# Patient Record
Sex: Male | Born: 1950
Health system: Southern US, Community
[De-identification: ages and names within clinical notes are randomized; demographics above are authoritative.]

## PROBLEM LIST (undated history)

## (undated) DIAGNOSIS — I10 Essential (primary) hypertension: Secondary | ICD-10-CM

## (undated) DIAGNOSIS — C61 Malignant neoplasm of prostate: Secondary | ICD-10-CM

## (undated) HISTORY — PX: ROTATOR CUFF REPAIR: SHX139

## (undated) HISTORY — PX: REPLACEMENT TOTAL KNEE: SUR1224

---

## 2005-10-12 ENCOUNTER — Inpatient Hospital Stay (HOSPITAL_COMMUNITY): Admission: RE | Admit: 2005-10-12 | Discharge: 2005-10-16 | Payer: Self-pay | Admitting: Orthopedic Surgery

## 2007-01-28 IMAGING — CR DG CHEST 2V
2 series · 2 of 2 positions shown · non-contrast
Comparison: None.

CLINICAL DATA: Preop right knee.
 CHEST - 2 VIEW:

[view not recorded (1 of 2)]
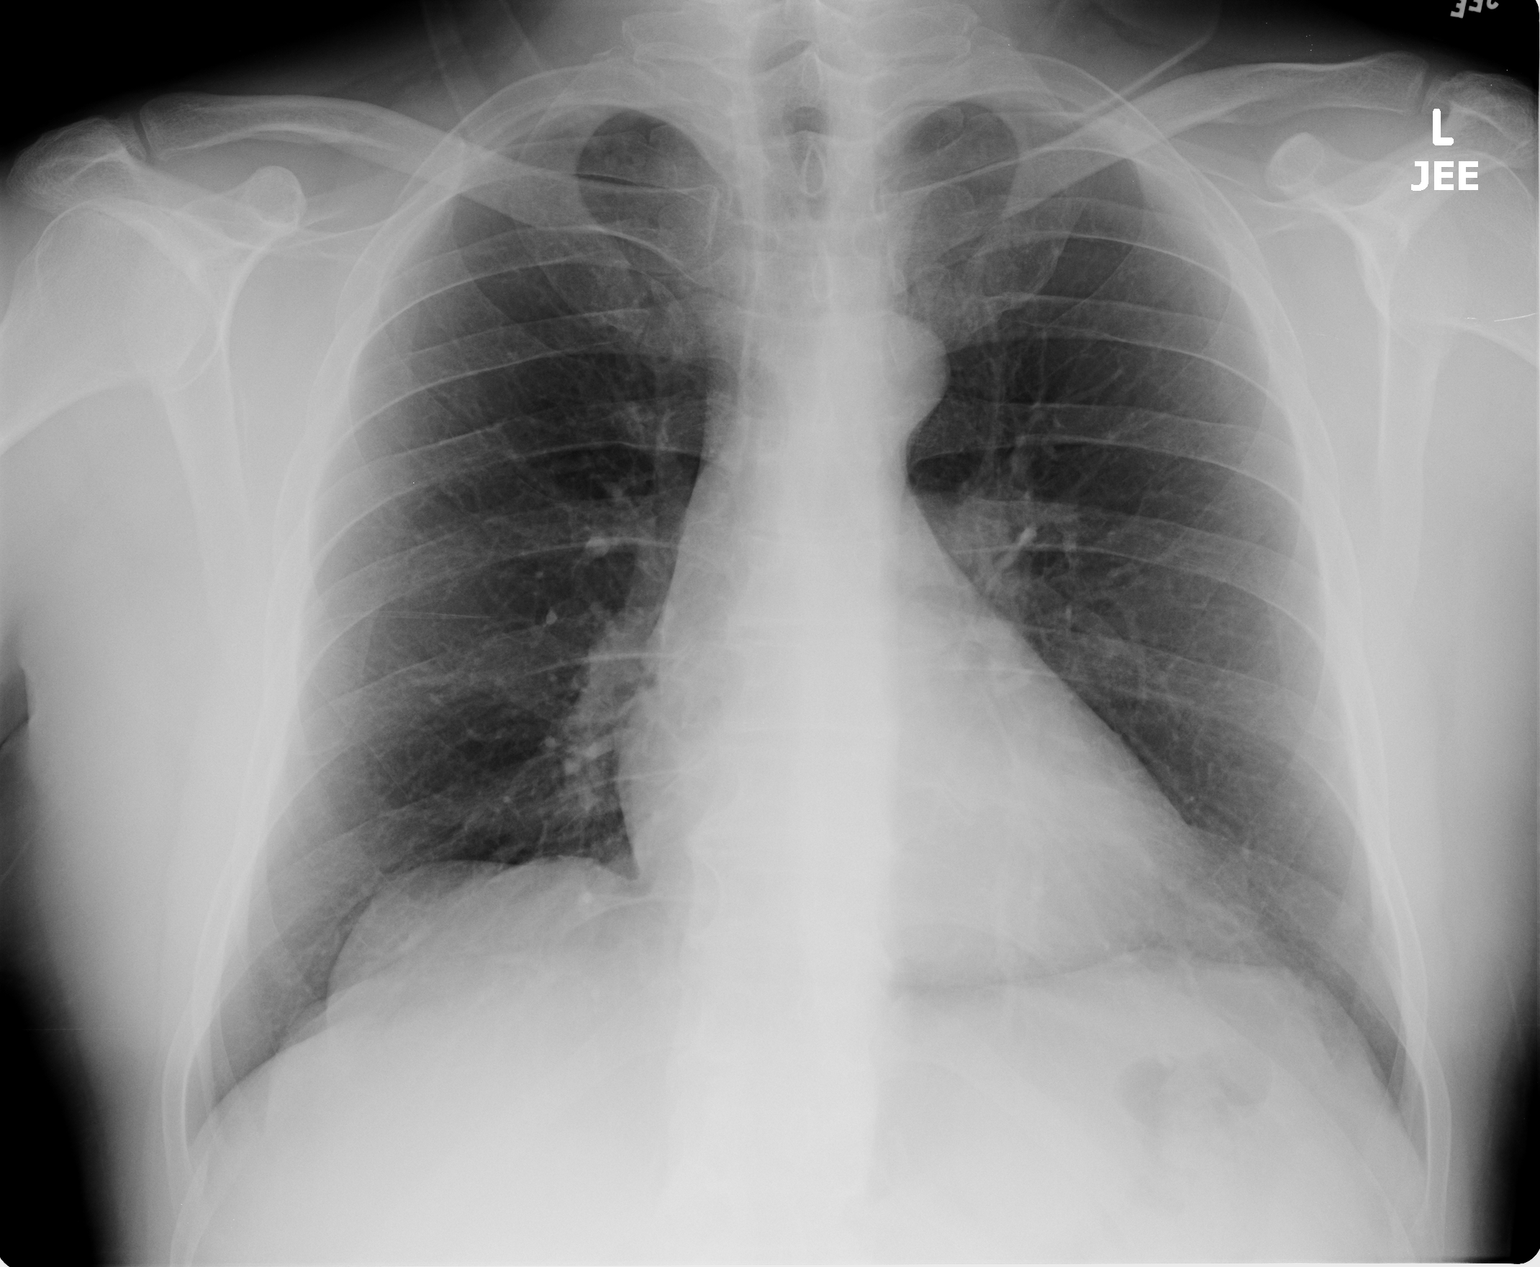

[view not recorded (2 of 2)]
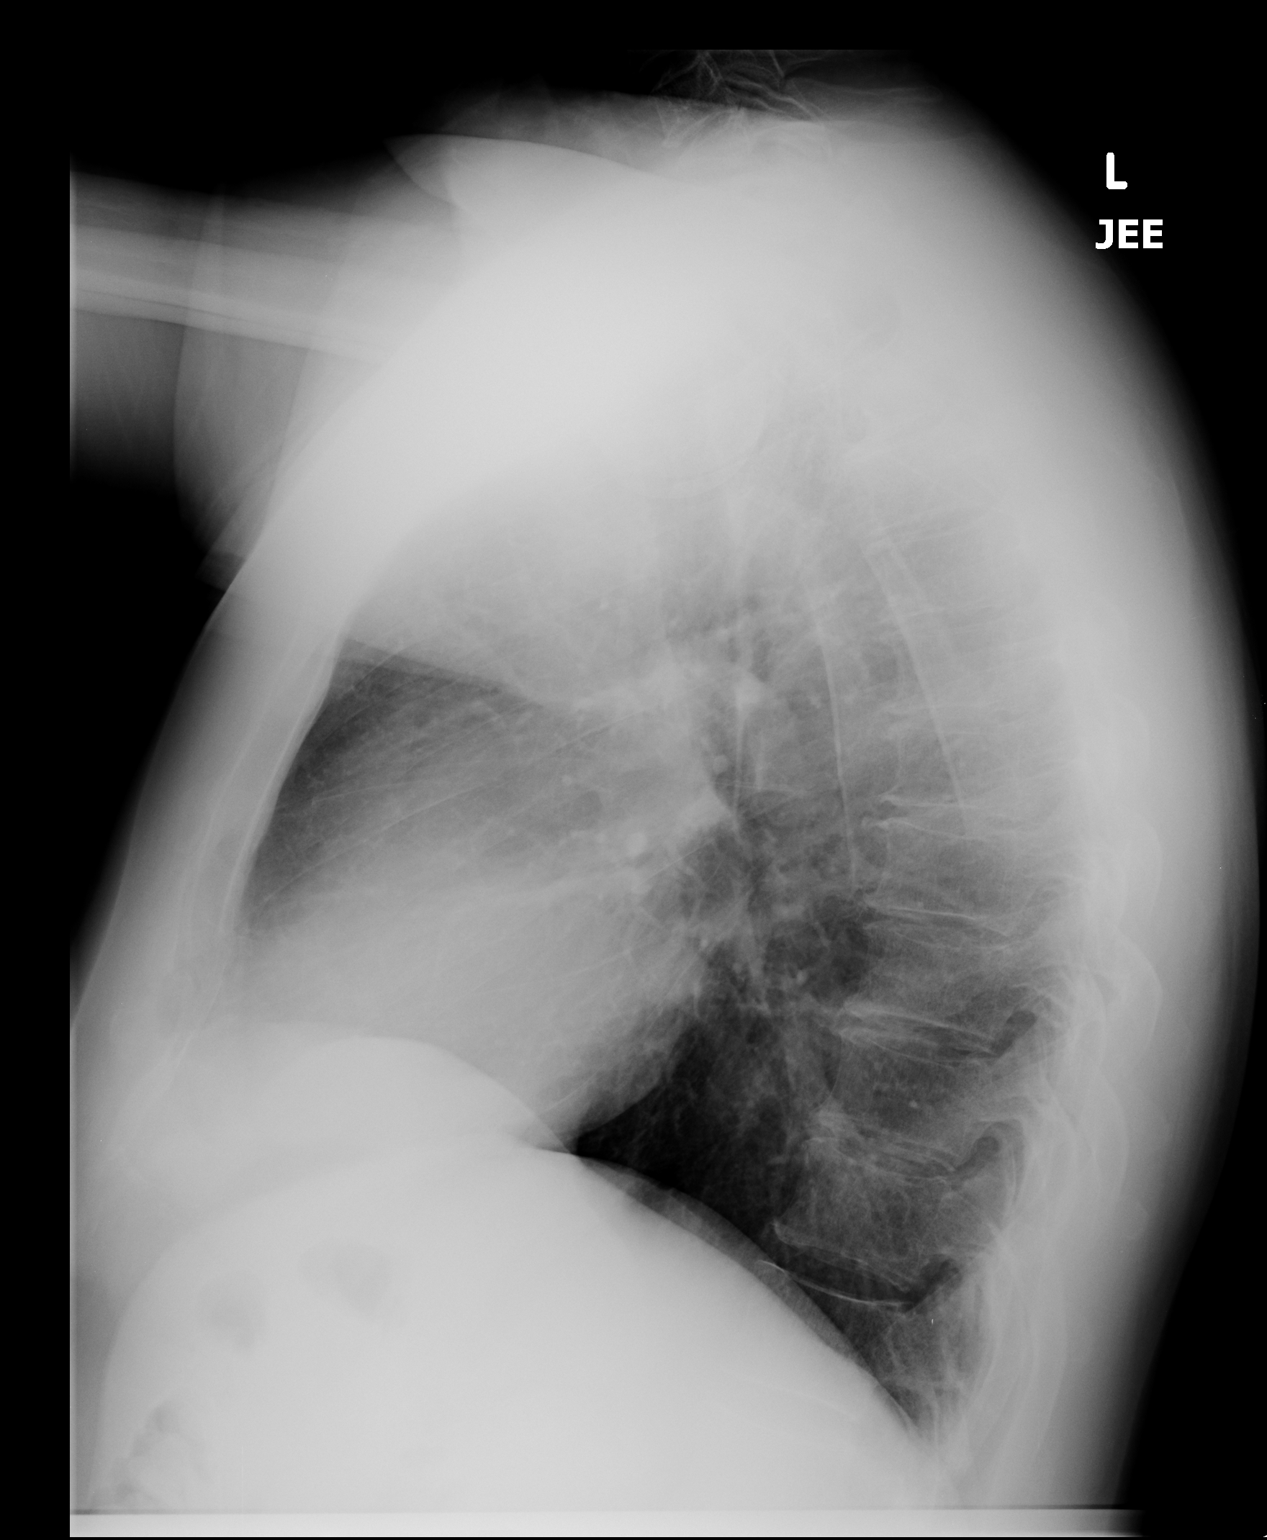

[2 of 2 positions shown; findings below may reference images not displayed]

FINDINGS: Heart size is within normal limits.  Trachea is midline.  Lungs are clear.  No pleural fluid.
IMPRESSION: No acute findings.

## 2007-01-31 IMAGING — CR DG KNEE 1-2V PORT*R*
2 series · 2 of 2 positions shown · non-contrast
Comparison: none

CLINICAL DATA: Osteoarthritis.  Right TKA.  
 PORTABLE RIGHT KNEE - 1 VIEW 10/12/05 AT 4147 HOURS:

[view not recorded (1 of 2)]
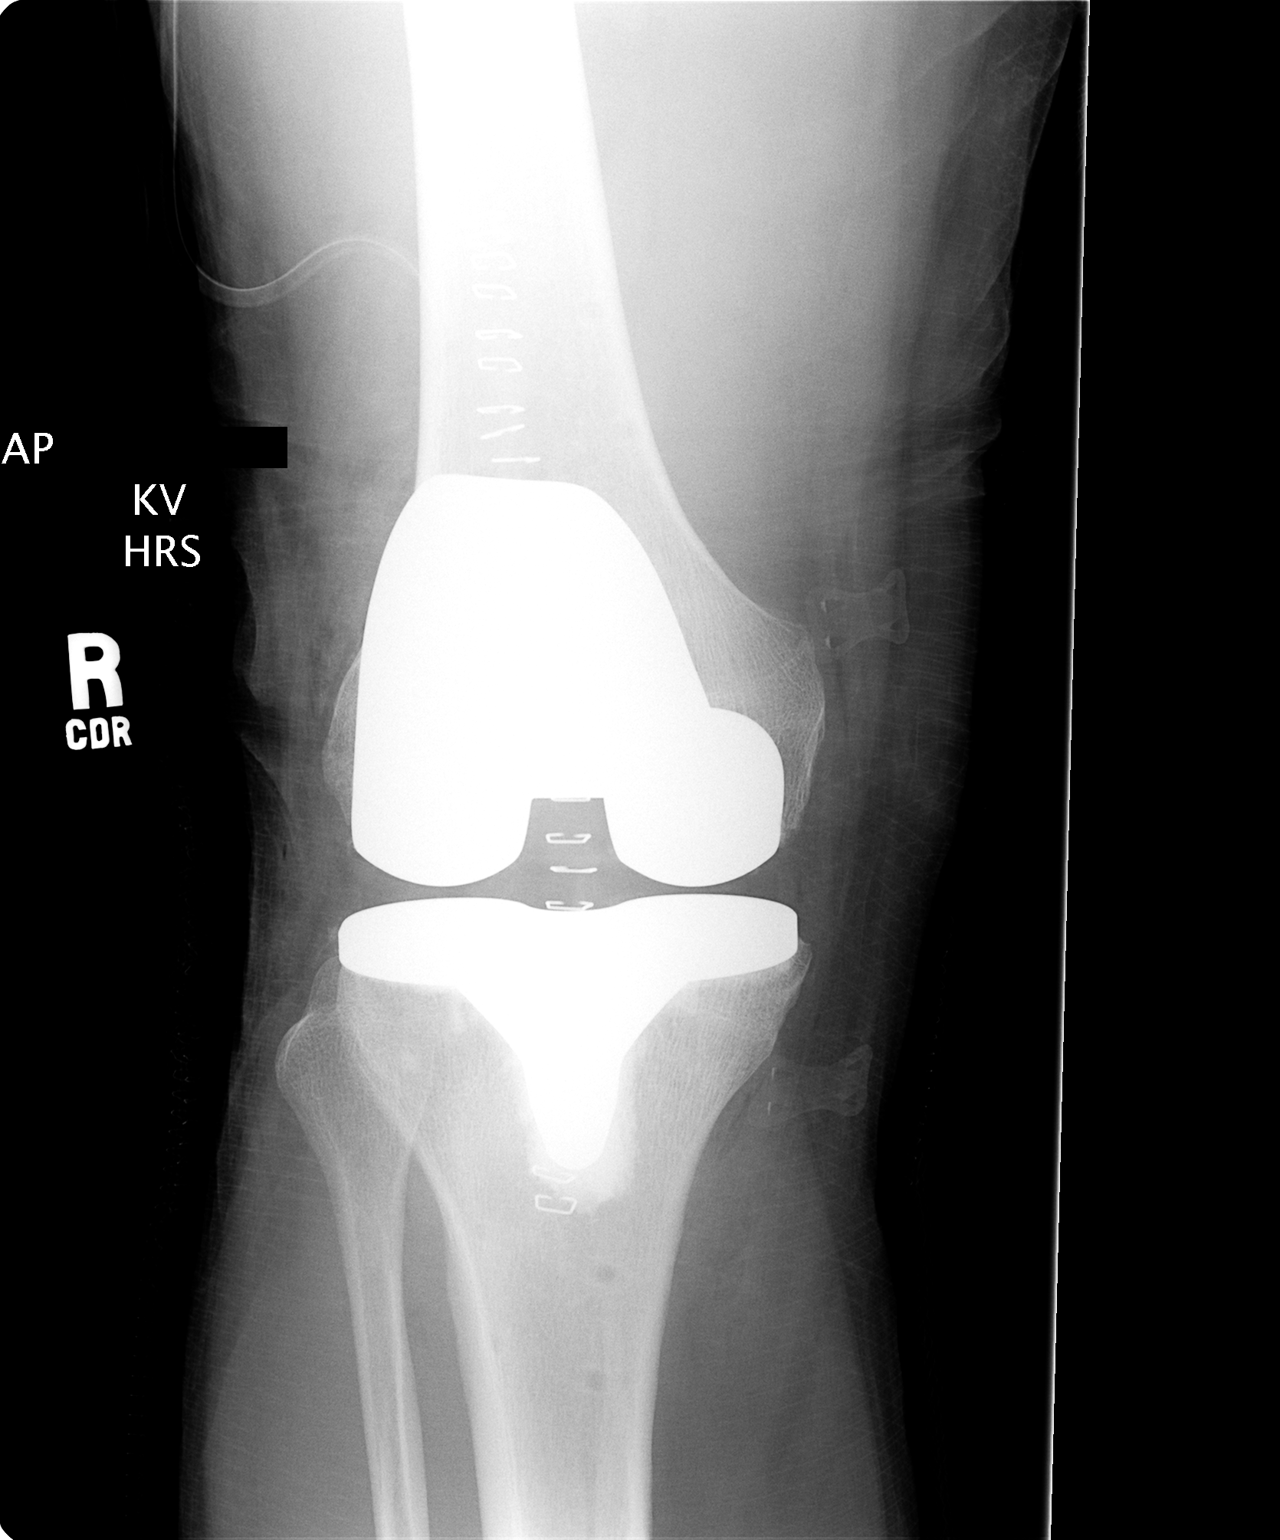

[view not recorded (2 of 2)]
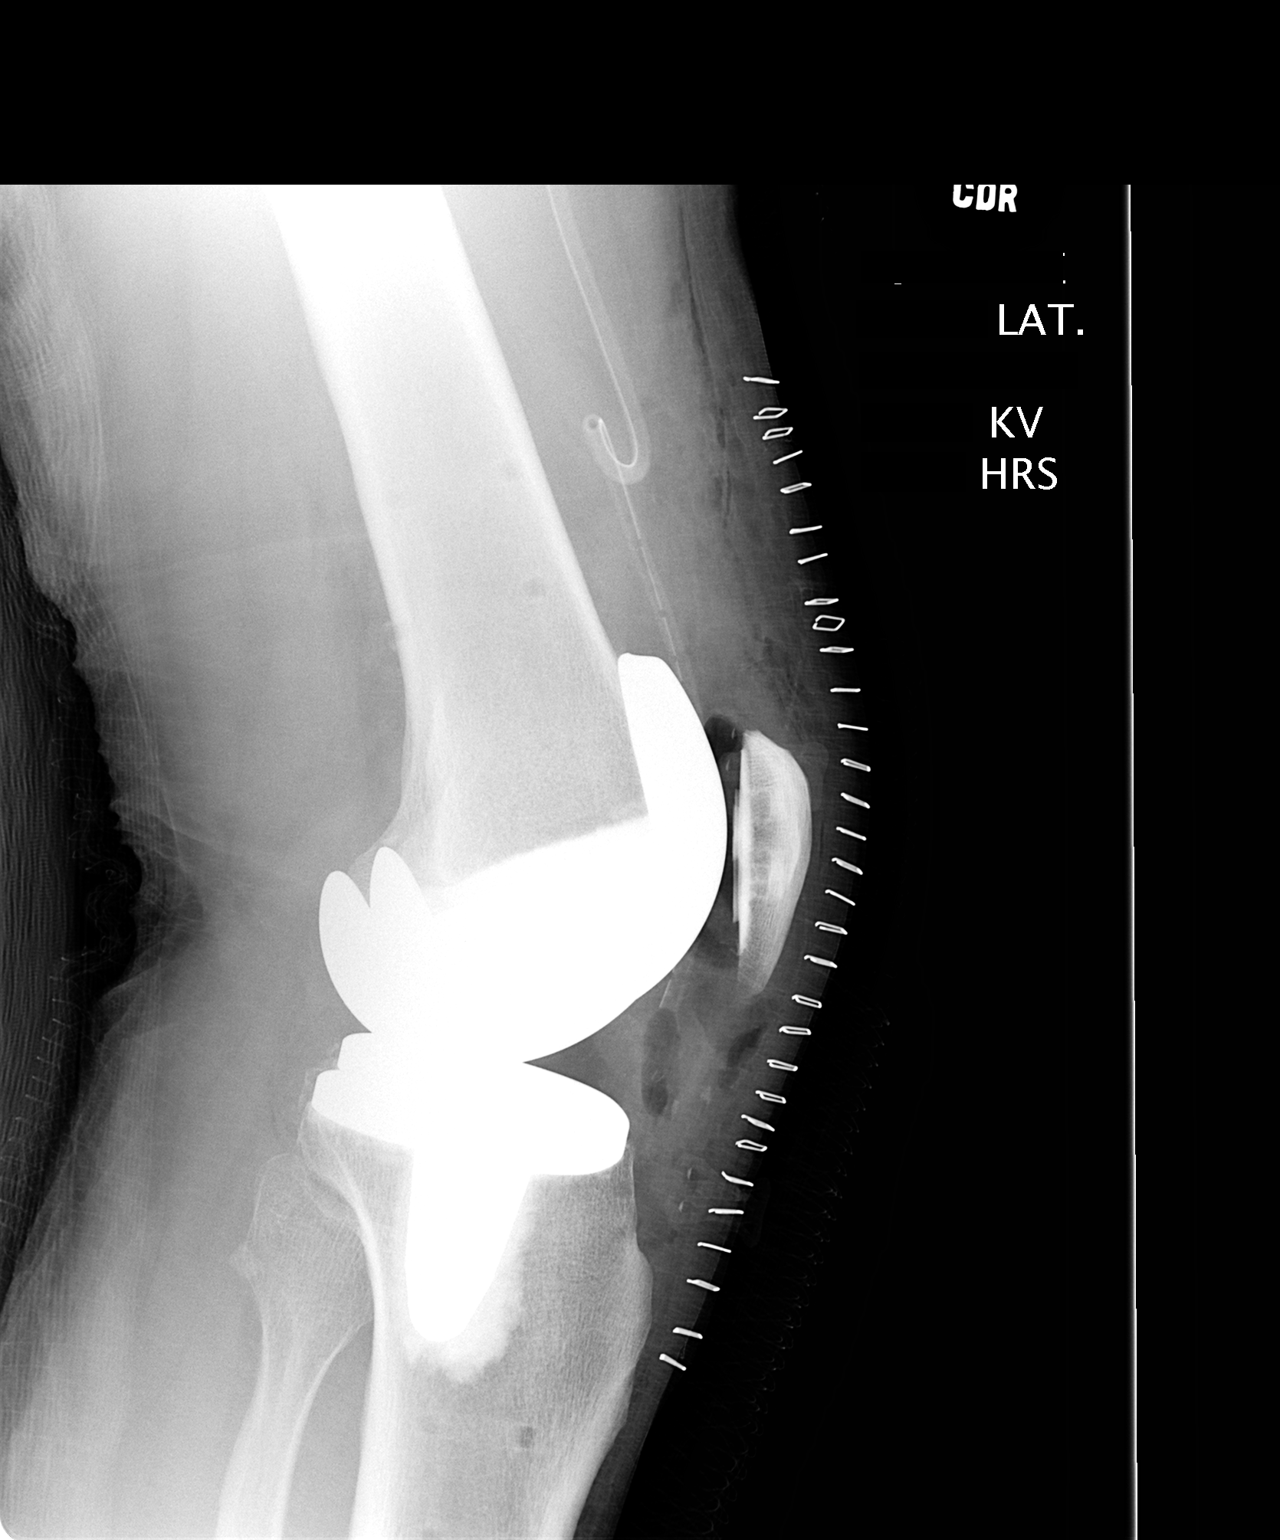

[2 of 2 positions shown; findings below may reference images not displayed]

FINDINGS: Distal femoral proximal tibial and patellar prosthetic components appear in satisfactory position and alignment.  Vertical row of superficial staples anteriorly.  Radiopaque drain in place.
IMPRESSION: Satisfactory appearance right TKA.

## 2018-08-08 DIAGNOSIS — Z23 Encounter for immunization: Secondary | ICD-10-CM | POA: Diagnosis not present

## 2018-08-08 DIAGNOSIS — C787 Secondary malignant neoplasm of liver and intrahepatic bile duct: Secondary | ICD-10-CM | POA: Diagnosis not present

## 2018-08-08 DIAGNOSIS — R972 Elevated prostate specific antigen [PSA]: Secondary | ICD-10-CM | POA: Diagnosis not present

## 2018-08-08 DIAGNOSIS — N400 Enlarged prostate without lower urinary tract symptoms: Secondary | ICD-10-CM | POA: Diagnosis not present

## 2018-08-08 DIAGNOSIS — Z20828 Contact with and (suspected) exposure to other viral communicable diseases: Secondary | ICD-10-CM | POA: Diagnosis not present

## 2018-08-08 DIAGNOSIS — E782 Mixed hyperlipidemia: Secondary | ICD-10-CM | POA: Diagnosis not present

## 2018-08-08 DIAGNOSIS — C184 Malignant neoplasm of transverse colon: Secondary | ICD-10-CM | POA: Diagnosis not present

## 2018-08-08 DIAGNOSIS — K449 Diaphragmatic hernia without obstruction or gangrene: Secondary | ICD-10-CM | POA: Diagnosis not present

## 2018-08-08 DIAGNOSIS — Z01812 Encounter for preprocedural laboratory examination: Secondary | ICD-10-CM | POA: Diagnosis not present

## 2018-08-08 DIAGNOSIS — Z6829 Body mass index (BMI) 29.0-29.9, adult: Secondary | ICD-10-CM | POA: Diagnosis not present

## 2018-08-08 DIAGNOSIS — I10 Essential (primary) hypertension: Secondary | ICD-10-CM | POA: Diagnosis not present

## 2018-08-08 DIAGNOSIS — Z79899 Other long term (current) drug therapy: Secondary | ICD-10-CM | POA: Diagnosis not present

## 2018-08-08 DIAGNOSIS — R739 Hyperglycemia, unspecified: Secondary | ICD-10-CM | POA: Diagnosis not present

## 2018-08-08 DIAGNOSIS — E663 Overweight: Secondary | ICD-10-CM | POA: Diagnosis not present

## 2018-08-08 DIAGNOSIS — T63441D Toxic effect of venom of bees, accidental (unintentional), subsequent encounter: Secondary | ICD-10-CM | POA: Diagnosis not present

## 2018-08-08 DIAGNOSIS — K2100 Gastro-esophageal reflux disease with esophagitis, without bleeding: Secondary | ICD-10-CM | POA: Diagnosis not present

## 2018-11-08 DIAGNOSIS — R972 Elevated prostate specific antigen [PSA]: Secondary | ICD-10-CM | POA: Diagnosis not present

## 2018-11-08 DIAGNOSIS — N401 Enlarged prostate with lower urinary tract symptoms: Secondary | ICD-10-CM | POA: Diagnosis not present

## 2018-11-08 DIAGNOSIS — R3912 Poor urinary stream: Secondary | ICD-10-CM | POA: Diagnosis not present

## 2018-12-27 DIAGNOSIS — R972 Elevated prostate specific antigen [PSA]: Secondary | ICD-10-CM | POA: Diagnosis not present

## 2019-02-04 DIAGNOSIS — C61 Malignant neoplasm of prostate: Secondary | ICD-10-CM | POA: Diagnosis not present

## 2019-05-09 DIAGNOSIS — C61 Malignant neoplasm of prostate: Secondary | ICD-10-CM | POA: Diagnosis not present

## 2019-05-09 DIAGNOSIS — R3912 Poor urinary stream: Secondary | ICD-10-CM | POA: Diagnosis not present

## 2019-05-09 DIAGNOSIS — N401 Enlarged prostate with lower urinary tract symptoms: Secondary | ICD-10-CM | POA: Diagnosis not present

## 2019-05-16 ENCOUNTER — Other Ambulatory Visit: Payer: Self-pay | Admitting: Urology

## 2019-05-26 DIAGNOSIS — Z20828 Contact with and (suspected) exposure to other viral communicable diseases: Secondary | ICD-10-CM | POA: Diagnosis not present

## 2019-05-26 DIAGNOSIS — Z20822 Contact with and (suspected) exposure to covid-19: Secondary | ICD-10-CM | POA: Diagnosis not present

## 2019-05-29 DIAGNOSIS — C61 Malignant neoplasm of prostate: Secondary | ICD-10-CM | POA: Diagnosis not present

## 2019-05-29 DIAGNOSIS — M6289 Other specified disorders of muscle: Secondary | ICD-10-CM | POA: Diagnosis not present

## 2019-05-29 DIAGNOSIS — R3912 Poor urinary stream: Secondary | ICD-10-CM | POA: Diagnosis not present

## 2019-05-29 DIAGNOSIS — M6281 Muscle weakness (generalized): Secondary | ICD-10-CM | POA: Diagnosis not present

## 2019-05-29 DIAGNOSIS — M62838 Other muscle spasm: Secondary | ICD-10-CM | POA: Diagnosis not present

## 2019-06-11 NOTE — Patient Instructions (Addendum)
DUE TO COVID-19 ONLY ONE VISITOR IS ALLOWED TO COME WITH YOU AND STAY IN THE WAITING ROOM ONLY DURING PRE OP AND PROCEDURE DAY OF SURGERY. THE 1 VISITOR MAY VISIT WITH YOU AFTER SURGERY IN YOUR PRIVATE ROOM DURING VISITING HOURS ONLY!  YOU NEED TO HAVE A COVID 19 TEST ON: 06/19/19 @  11:00 am      , THIS TEST MUST BE DONE BEFORE SURGERY, COME  [place], [address] , [address].  ([place]) ONCE YOUR COVID TEST IS COMPLETED, PLEASE BEGIN THE QUARANTINE INSTRUCTIONS AS OUTLINED IN YOUR HANDOUT.                MARIN KOELLE     Your procedure is scheduled on: 06/23/19   Report to [facility] Main  Entrance   Report to admitting at: 9:30 AM     Call this number if you have problems the morning of surgery [phone]     Remember: Do not eat food or drink liquids :After Midnight.   BRUSH YOUR TEETH MORNING OF SURGERY AND RINSE YOUR MOUTH OUT, NO CHEWING GUM CANDY OR MINTS.     Take these medicines the morning of surgery with A SIP OF WATER: Tamsulosin (Flomax)                                  You may not have any metal on your body including hair pins and              piercings  Do not wear jewelry, lotions, powders or perfumes, deodorant             Men may shave face and neck.     Do not bring valuables to the hospital. [place].  Contacts, dentures or bridgework may not be worn into surgery.  Leave suitcase in the car. After surgery it may be brought to your room.     Patients discharged the day of surgery will not be allowed to drive home. IF YOU ARE HAVING SURGERY AND GOING HOME THE SAME DAY, YOU MUST HAVE AN ADULT TO DRIVE YOU HOME AND BE WITH YOU FOR 24 HOURS. YOU MAY GO HOME BY TAXI OR UBER OR ORTHERWISE, BUT AN ADULT MUST ACCOMPANY YOU HOME AND STAY WITH YOU FOR 24 HOURS.  Name and phone number of your driver:  Special Instructions: N/A              Please read over the following fact  sheets you were given: _____________________________________________________________________             [facility] - Preparing for Surgery Before surgery, you can play an important role.  Because skin is not sterile, your skin needs to be as free of germs as possible.  You can reduce the number of germs on your skin by washing with CHG (chlorahexidine gluconate) soap before surgery.  CHG is an antiseptic cleaner which kills germs and bonds with the skin to continue killing germs even after washing. Please DO NOT use if you have an allergy to CHG or antibacterial soaps.  If your skin becomes reddened/irritated stop using the CHG and inform your nurse when you arrive at Short Stay. Do not shave (including legs and underarms) for at least 48 hours prior to the first  CHG shower.  You may shave your face/neck. Please follow these instructions carefully:  1.  Shower with CHG Soap the night before surgery and the  morning of Surgery.  2.  If you choose to wash your hair, wash your hair first as usual with your  normal  shampoo.  3.  After you shampoo, rinse your hair and body thoroughly to remove the  shampoo.                           4.  Use CHG as you would any other liquid soap.  You can apply chg directly  to the skin and wash                       Gently with a scrungie or clean washcloth.  5.  Apply the CHG Soap to your body ONLY FROM THE NECK DOWN.   Do not use on face/ open                           Wound or open sores. Avoid contact with eyes, ears mouth and genitals (private parts).                       Wash face,  Genitals (private parts) with your normal soap.             6.  Wash thoroughly, paying special attention to the area where your surgery  will be performed.  7.  Thoroughly rinse your body with warm water from the neck down.  8.  DO NOT shower/wash with your normal soap after using and rinsing off  the CHG Soap.                9.  Pat yourself dry with a clean towel.             10.  Wear clean pajamas.            11.  Place clean sheets on your bed the night of your first shower and do not  sleep with pets. Day of Surgery : Do not apply any lotions/deodorants the morning of surgery.  Please wear clean clothes to the hospital/surgery center.  FAILURE TO FOLLOW THESE INSTRUCTIONS MAY RESULT IN THE CANCELLATION OF YOUR SURGERY PATIENT SIGNATURE_________________________________  NURSE SIGNATURE__________________________________  ________________________________________________________________________   Adam Phenix  An incentive spirometer is a tool that can help keep your lungs clear and active. This tool measures how well you are filling your lungs with each breath. Taking long deep breaths may help reverse or decrease the chance of developing breathing (pulmonary) problems (especially infection) following:  A long period of time when you are unable to move or be active. BEFORE THE PROCEDURE   If the spirometer includes an indicator to show your best effort, your nurse or respiratory therapist will set it to a desired goal.  If possible, sit up straight or lean slightly forward. Try not to slouch.  Hold the incentive spirometer in an upright position. INSTRUCTIONS FOR USE  1. Sit on the edge of your bed if possible, or sit up as far as you can in bed or on a chair. 2. Hold the incentive spirometer in an upright position. 3. Breathe out normally. 4. Place the mouthpiece in your mouth and seal your lips tightly around it. 5. Breathe in slowly and as deeply  as possible, raising the piston or the ball toward the top of the column. 6. Hold your breath for 3-5 seconds or for as long as possible. Allow the piston or ball to fall to the bottom of the column. 7. Remove the mouthpiece from your mouth and breathe out normally. 8. Rest for a few seconds and repeat Steps 1 through 7 at least 10 times every 1-2 hours when you are awake. Take your time and take a  few normal breaths between deep breaths. 9. The spirometer may include an indicator to show your best effort. Use the indicator as a goal to work toward during each repetition. 10. After each set of 10 deep breaths, practice coughing to be sure your lungs are clear. If you have an incision (the cut made at the time of surgery), support your incision when coughing by placing a pillow or rolled up towels firmly against it. Once you are able to get out of bed, walk around indoors and cough well. You may stop using the incentive spirometer when instructed by your caregiver.  RISKS AND COMPLICATIONS  Take your time so you do not get dizzy or light-headed.  If you are in pain, you may need to take or ask for pain medication before doing incentive spirometry. It is harder to take a deep breath if you are having pain. AFTER USE  Rest and breathe slowly and easily.  It can be helpful to keep track of a log of your progress. Your caregiver can provide you with a simple table to help with this. If you are using the spirometer at home, follow these instructions: [place] IF:   You are having difficultly using the spirometer.  You have trouble using the spirometer as often as instructed.  Your pain medication is not giving enough relief while using the spirometer.  You develop fever of 100.5 F (38.1 C) or higher. SEEK IMMEDIATE MEDICAL CARE IF:   You cough up bloody sputum that had not been present before.  You develop fever of 102 F (38.9 C) or greater.  You develop worsening pain at or near the incision site. MAKE SURE YOU:   Understand these instructions.  Will watch your condition.  Will get help right away if you are not doing well or get worse. Document Released: 08/21/2006 Document Revised: 07/03/2011 Document Reviewed: 10/22/2006 Exit[employer] Patient Information 2014 [employer], [place].   ________________________________________________________________________   WHAT IS  A BLOOD TRANSFUSION? Blood Transfusion Information  A transfusion is the replacement of blood or some of its parts. Blood is made up of multiple cells which provide different functions.  Red blood cells carry oxygen and are used for blood loss replacement.  White blood cells fight against infection.  Platelets control bleeding.  Plasma helps clot blood.  Other blood products are available for specialized needs, such as hemophilia or other clotting disorders. BEFORE THE TRANSFUSION  Who gives blood for transfusions?   Healthy volunteers who are fully evaluated to make sure their blood is safe. This is blood bank blood. Transfusion therapy is the safest it has ever been in the practice of medicine. Before blood is taken from a donor, a complete history is taken to make sure that person has no history of diseases nor engages in risky social behavior (examples are intravenous drug use or sexual activity with multiple partners). The donor's travel history is screened to minimize risk of transmitting infections, such as malaria. The donated blood is tested for signs of infectious  diseases, such as HIV and hepatitis. The blood is then tested to be sure it is compatible with you in order to minimize the chance of a transfusion reaction. If you or a relative donates blood, this is often done in anticipation of surgery and is not appropriate for emergency situations. It takes many days to process the donated blood. RISKS AND COMPLICATIONS Although transfusion therapy is very safe and saves many lives, the main dangers of transfusion include:   Getting an infectious disease.  Developing a transfusion reaction. This is an allergic reaction to something in the blood you were given. Every precaution is taken to prevent this. The decision to have a blood transfusion has been considered carefully by your caregiver before blood is given. Blood is not given unless the benefits outweigh the risks. AFTER THE  TRANSFUSION  Right after receiving a blood transfusion, you will usually feel much better and more energetic. This is especially true if your red blood cells have gotten low (anemic). The transfusion raises the level of the red blood cells which carry oxygen, and this usually causes an energy increase.  The nurse administering the transfusion will monitor you carefully for complications. HOME CARE INSTRUCTIONS  No special instructions are needed after a transfusion. You may find your energy is better. Speak with your caregiver about any limitations on activity for underlying diseases you may have. SEEK MEDICAL CARE IF:   Your condition is not improving after your transfusion.  You develop redness or irritation at the intravenous (IV) site. SEEK IMMEDIATE MEDICAL CARE IF:  Any of the following symptoms occur over the next 12 hours:  Shaking chills.  You have a temperature by mouth above 102 F (38.9 C), not controlled by medicine.  Chest, back, or muscle pain.  People around you feel you are not acting correctly or are confused.  Shortness of breath or difficulty breathing.  Dizziness and fainting.  You get a rash or develop hives.  You have a decrease in urine output.  Your urine turns a dark color or changes to pink, red, or brown. Any of the following symptoms occur over the next 10 days:  You have a temperature by mouth above 102 F (38.9 C), not controlled by medicine.  Shortness of breath.  Weakness after normal activity.  The white part of the eye turns yellow (jaundice).  You have a decrease in the amount of urine or are urinating less often.  Your urine turns a dark color or changes to pink, red, or brown. Document Released: 04/07/2000 Document Revised: 07/03/2011 Document Reviewed: 11/25/2007 [facility] Patient Information 2014 [place], [place].  _______________________________________________________________________

## 2019-06-12 ENCOUNTER — Encounter (HOSPITAL_COMMUNITY)
Admission: RE | Admit: 2019-06-12 | Discharge: 2019-06-12 | Disposition: A | Discharge status: Home / Self Care | Payer: PPO | Source: Ambulatory Visit | Attending: Urology | Admitting: Urology

## 2019-06-12 ENCOUNTER — Other Ambulatory Visit: Payer: Self-pay

## 2019-06-12 ENCOUNTER — Encounter (HOSPITAL_COMMUNITY): Payer: Self-pay

## 2019-06-12 DIAGNOSIS — Z01818 Encounter for other preprocedural examination: Secondary | ICD-10-CM | POA: Diagnosis not present

## 2019-06-12 HISTORY — DX: Essential (primary) hypertension: I10

## 2019-06-12 NOTE — Progress Notes (Signed)
PCP - Dr. Christa See. LOV: 08/08/18 Cardiologist -   Chest x-ray -  EKG -  Stress Test -  ECHO -  Cardiac Cath -   Sleep Study -  CPAP -   Fasting Blood Sugar -  Checks Blood Sugar _____ times a day  Blood Thinner Instructions: Aspirin Instructions: Last Dose:  Anesthesia review:   Patient denies shortness of breath, fever, cough and chest pain at PAT appointment   Patient verbalized understanding of instructions that were given to them at the PAT appointment. Patient was also instructed that they will need to review over the PAT instructions again at home before surgery.

## 2019-06-19 ENCOUNTER — Encounter (HOSPITAL_COMMUNITY)
Admission: RE | Admit: 2019-06-19 | Discharge: 2019-06-19 | Disposition: A | Discharge status: Home / Self Care | Payer: PPO | Source: Ambulatory Visit | Attending: Urology | Admitting: Urology

## 2019-06-19 ENCOUNTER — Other Ambulatory Visit: Payer: Self-pay

## 2019-06-19 ENCOUNTER — Other Ambulatory Visit (HOSPITAL_COMMUNITY)
Admission: RE | Admit: 2019-06-19 | Discharge: 2019-06-19 | Disposition: A | Discharge status: Home / Self Care | Payer: PPO | Source: Ambulatory Visit | Attending: Urology | Admitting: Urology

## 2019-06-19 DIAGNOSIS — Z20822 Contact with and (suspected) exposure to covid-19: Secondary | ICD-10-CM | POA: Diagnosis not present

## 2019-06-19 DIAGNOSIS — Z01812 Encounter for preprocedural laboratory examination: Secondary | ICD-10-CM | POA: Insufficient documentation

## 2019-06-19 DIAGNOSIS — M6281 Muscle weakness (generalized): Secondary | ICD-10-CM | POA: Diagnosis not present

## 2019-06-19 DIAGNOSIS — Z01810 Encounter for preprocedural cardiovascular examination: Secondary | ICD-10-CM | POA: Diagnosis not present

## 2019-06-19 DIAGNOSIS — N393 Stress incontinence (female) (male): Secondary | ICD-10-CM | POA: Diagnosis not present

## 2019-06-19 DIAGNOSIS — M62838 Other muscle spasm: Secondary | ICD-10-CM | POA: Diagnosis not present

## 2019-06-19 LAB — CBC
HCT: 47 % (ref 39.0–52.0)
Hemoglobin: 15.4 g/dL (ref 13.0–17.0)
MCH: 31.6 pg (ref 26.0–34.0)
MCHC: 32.8 g/dL (ref 30.0–36.0)
MCV: 96.3 fL (ref 80.0–100.0)
Platelets: 237 10*3/uL (ref 150–400)
RBC: 4.88 MIL/uL (ref 4.22–5.81)
RDW: 12.4 % (ref 11.5–15.5)
WBC: 4.1 10*3/uL (ref 4.0–10.5)
nRBC: 0 % (ref 0.0–0.2)

## 2019-06-19 LAB — ABO/RH: ABO/RH(D): O POS

## 2019-06-19 LAB — BASIC METABOLIC PANEL
Anion gap: 7 (ref 5–15)
BUN: 18 mg/dL (ref 8–23)
CO2: 27 mmol/L (ref 22–32)
Calcium: 9.9 mg/dL (ref 8.9–10.3)
Chloride: 107 mmol/L (ref 98–111)
Creatinine, Ser: 1.06 mg/dL (ref 0.61–1.24)
GFR calc Af Amer: >60 mL/min (ref >60)
GFR calc non Af Amer: >60 mL/min (ref >60)
Glucose, Bld: 102 mg/dL — ABNORMAL HIGH (ref 70–99)
Potassium: 4.3 mmol/L (ref 3.5–5.1)
Sodium: 141 mmol/L (ref 135–145)

## 2019-06-19 LAB — SARS CORONAVIRUS 2 (TAT 6-24 HRS): SARS Coronavirus 2: NEGATIVE

## 2019-06-20 NOTE — H&P (Signed)
CC: Prostate Cancer   PCP: Dr. Christa See   [name] is a 69 year old gentleman who was noted to have an elevated PSA of 10.36 that remained elevated at 4.18 when rechecked. 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   Family history: None.   Imaging studies: None.   PMH: He has a history of hypertension, hyperlipidemia, and BPH.  PSH: No abdominal surgeries.   TNM stage: cT1c Nx Mx  PSA: 4.18  Gleason score: 3+4=7 (grade group 2)  Biopsy (12/27/18): 5/12 cores positive  Left: L lateral apex (5%, 3+4=7), L lateral mid (5%, 3+4=7), L mid (30%, 3+4=7), L lateral base (90%, 3+4=7, PNI), L base (90%, 3+4=7, PNI)  Right: Benign  Prostate volume: 34.7 cc   Nomogram  OC disease: 64%  EPE: 34%  SVI: 3%  LNI: 3%  PFS (5 year, 10 year): 88%, 79%   Urinary function: IPSS is 23. He does take both tamsulosin and finasteride.  Erectile function: SHIM score is 14. He does feel that his erectile function has significantly worsened over the past few months.   Interval history:   After his last consultation in October last year, he was interested in a radiation oncology consultation. This was scheduled but he did not follow up as planned. I talked to him and he told me that he wished to proceed with surgery and wanted to proceed. However, after at attempt to scheduled surgical therapy, he again made the decision to hold off. He follows up today for further discussion. He does feel comfortable with his decision to proceed with surgery and wishes to discuss that further today.     ALLERGIES: None   MEDICATIONS: Finasteride  Tamsulosin Hcl  Ibuprofen  Lisinopril-Hydrochlorothiazide  Meloxicam     GU PSH: Prostate Needle Biopsy - 12/27/2018       PSH Notes: "Kidney Stone" Surgery 2000    NON-GU PSH: Knee Arthroscopy/surgery, Right Surgical Pathology,  Gross And Microscopic Examination For Prostate Needle - 12/27/2018     GU PMH: Prostate Cancer - 02/04/2019 BPH w/LUTS - 11/08/2018 Elevated PSA - 11/08/2018 Weak Urinary Stream - 11/08/2018    NON-GU PMH: Hypercholesterolemia Hypertension    FAMILY HISTORY: None    Notes: 1 son, parents deceased - no related family history    SOCIAL HISTORY: Marital Status: Married Preferred Language: English; Ethnicity: Not Hispanic Or Latino; Race: White Current Smoking Status: Patient has never smoked.   Tobacco Use Assessment Completed: Used Tobacco in last 30 days? Drinks 2 drinks per week.  Does not drink caffeine.    REVIEW OF SYSTEMS:    GU Review Male:   Patient denies frequent urination, hard to postpone urination, burning/ pain with urination, get up at night to urinate, leakage of urine, stream starts and stops, trouble starting your streams, and have to strain to urinate .  Gastrointestinal (Upper):   Patient denies nausea and vomiting.  Gastrointestinal (Lower):   Patient denies diarrhea and constipation.  Constitutional:   Patient denies fever, night sweats, weight loss, and fatigue.  Skin:   Patient denies skin rash/ lesion and itching.  Eyes:   Patient denies blurred vision and double vision.  Ears/ Nose/ Throat:   Patient denies sore throat and sinus problems.  Hematologic/Lymphatic:   Patient denies swollen glands and easy bruising.  Cardiovascular:   Patient denies leg swelling and chest pains.  Respiratory:   Patient denies cough and shortness of breath.  Endocrine:   Patient denies excessive thirst.  Musculoskeletal:   Patient denies joint pain and back pain.  Neurological:   Patient denies headaches and dizziness.  Psychologic:   Patient denies depression and anxiety.   VITAL SIGNS:     Weight 205 lb / 92.99 kg  Height 71 in / 180.34 cm  BMI 28.6 kg/m    MULTI-SYSTEM PHYSICAL EXAMINATION:    Constitutional: Well-nourished. No physical deformities. Normally  developed. Good grooming.  Respiratory: No labored breathing, no use of accessory muscles. Clear bilaterally.  Cardiovascular: Normal temperature, normal extremity pulses, no swelling, no varicosities. Regular rate and rhythm.  Gastrointestinal: No mass, no tenderness, no rigidity, non obese abdomen.       ASSESSMENT:      ICD-10 Details  1 GU:   Prostate Cancer - C61   2   BPH w/LUTS - N40.1   3   Weak Urinary Stream - R39.12    PLAN:       1. Favorable intermediate risk prostate cancer:  He will undergo a unilateral right nerve-sparing robot assisted laparoscopic radical prostatectomy and pelvic lymphadenectomy.

## 2019-06-23 ENCOUNTER — Other Ambulatory Visit: Payer: Self-pay

## 2019-06-23 ENCOUNTER — Encounter (HOSPITAL_COMMUNITY)
Admission: RE | Disposition: A | Discharge status: Home / Self Care | Payer: Self-pay | Source: Ambulatory Visit | Attending: Urology

## 2019-06-23 ENCOUNTER — Observation Stay (HOSPITAL_COMMUNITY)
Admission: RE | Admit: 2019-06-23 | Discharge: 2019-06-24 | Disposition: A | Discharge status: Home / Self Care | Payer: PPO | Source: Ambulatory Visit | Attending: Urology | Admitting: Urology

## 2019-06-23 ENCOUNTER — Ambulatory Visit (HOSPITAL_COMMUNITY): Payer: PPO | Admitting: Certified Registered Nurse Anesthetist

## 2019-06-23 ENCOUNTER — Encounter (HOSPITAL_COMMUNITY): Payer: Self-pay | Admitting: Urology

## 2019-06-23 ENCOUNTER — Ambulatory Visit (HOSPITAL_COMMUNITY): Payer: PPO | Admitting: Physician Assistant

## 2019-06-23 DIAGNOSIS — N401 Enlarged prostate with lower urinary tract symptoms: Secondary | ICD-10-CM | POA: Insufficient documentation

## 2019-06-23 DIAGNOSIS — C61 Malignant neoplasm of prostate: Secondary | ICD-10-CM | POA: Diagnosis not present

## 2019-06-23 DIAGNOSIS — I10 Essential (primary) hypertension: Secondary | ICD-10-CM | POA: Diagnosis not present

## 2019-06-23 DIAGNOSIS — R3912 Poor urinary stream: Secondary | ICD-10-CM | POA: Diagnosis not present

## 2019-06-23 DIAGNOSIS — Z791 Long term (current) use of non-steroidal anti-inflammatories (NSAID): Secondary | ICD-10-CM | POA: Diagnosis not present

## 2019-06-23 DIAGNOSIS — Z79899 Other long term (current) drug therapy: Secondary | ICD-10-CM | POA: Diagnosis not present

## 2019-06-23 HISTORY — PX: LYMPHADENECTOMY: SHX5960

## 2019-06-23 HISTORY — PX: ROBOT ASSISTED LAPAROSCOPIC RADICAL PROSTATECTOMY: SHX5141

## 2019-06-23 LAB — TYPE AND SCREEN
ABO/RH(D): O POS
Antibody Screen: NEGATIVE

## 2019-06-23 LAB — HEMOGLOBIN AND HEMATOCRIT, BLOOD
HCT: 42.2 % (ref 39.0–52.0)
Hemoglobin: 13.7 g/dL (ref 13.0–17.0)

## 2019-06-23 SURGERY — XI ROBOTIC ASSISTED LAPAROSCOPIC RADICAL PROSTATECTOMY LEVEL 2
Anesthesia: General

## 2019-06-23 MED ORDER — HYDROMORPHONE HCL 1 MG/ML IJ SOLN
INTRAMUSCULAR | Status: AC
  Filled 2019-06-23: qty 1

## 2019-06-23 MED ORDER — LIDOCAINE HCL 2 % IJ SOLN
INTRAMUSCULAR | Status: AC
  Filled 2019-06-23: qty 20

## 2019-06-23 MED ORDER — BUPIVACAINE HCL (PF) 0.25 % IJ SOLN
INTRAMUSCULAR | Status: DC | PRN
  Administered 2019-06-23: 35 mL

## 2019-06-23 MED ORDER — LACTATED RINGERS IV SOLN: INTRAVENOUS | Status: DC | PRN

## 2019-06-23 MED ORDER — LIDOCAINE 20MG/ML (2%) 15 ML SYRINGE OPTIME
INTRAMUSCULAR | Status: DC | PRN
  Administered 2019-06-23: 1.5 mg/kg/h via INTRAVENOUS

## 2019-06-23 MED ORDER — SODIUM CHLORIDE 0.9 % IV BOLUS
1000.0000 mL | Freq: Once | INTRAVENOUS | Status: AC
  Administered 2019-06-23: 1000 mL via INTRAVENOUS

## 2019-06-23 MED ORDER — CEFAZOLIN SODIUM-DEXTROSE 1-4 GM/50ML-% IV SOLN
1.0000 g | Freq: Three times a day (TID) | INTRAVENOUS | Status: AC
  Administered 2019-06-23 – 2019-06-24 (×2): 1 g via INTRAVENOUS
  Filled 2019-06-23 (×2): qty 50

## 2019-06-23 MED ORDER — BACITRACIN-NEOMYCIN-POLYMYXIN 400-5-5000 EX OINT: 1.0000 "application " | TOPICAL_OINTMENT | Freq: Three times a day (TID) | CUTANEOUS | Status: DC | PRN

## 2019-06-23 MED ORDER — ACETAMINOPHEN 10 MG/ML IV SOLN: 1000.0000 mg | Freq: Once | INTRAVENOUS | Status: DC | PRN

## 2019-06-23 MED ORDER — BUPIVACAINE HCL 0.25 % IJ SOLN
INTRAMUSCULAR | Status: AC
  Filled 2019-06-23: qty 1

## 2019-06-23 MED ORDER — KETOROLAC TROMETHAMINE 30 MG/ML IJ SOLN
30.0000 mg | Freq: Once | INTRAMUSCULAR | Status: AC | PRN
  Administered 2019-06-23: 30 mg via INTRAVENOUS

## 2019-06-23 MED ORDER — TRAMADOL HCL 50 MG PO TABS: 50.0000 mg | ORAL_TABLET | Freq: Four times a day (QID) | ORAL | 0 refills | Status: DC | PRN

## 2019-06-23 MED ORDER — BELLADONNA ALKALOIDS-OPIUM 16.2-60 MG RE SUPP: 1.0000 | Freq: Four times a day (QID) | RECTAL | Status: DC | PRN

## 2019-06-23 MED ORDER — MIDAZOLAM HCL 2 MG/2ML IJ SOLN
INTRAMUSCULAR | Status: AC
  Filled 2019-06-23: qty 2

## 2019-06-23 MED ORDER — FLEET ENEMA 7-19 GM/118ML RE ENEM: 1.0000 | ENEMA | Freq: Once | RECTAL | Status: DC

## 2019-06-23 MED ORDER — KETAMINE HCL 10 MG/ML IJ SOLN
INTRAMUSCULAR | Status: DC | PRN
  Administered 2019-06-23: 20 mg via INTRAVENOUS
  Administered 2019-06-23 (×2): 40 mg via INTRAVENOUS

## 2019-06-23 MED ORDER — ROCURONIUM BROMIDE 10 MG/ML (PF) SYRINGE
PREFILLED_SYRINGE | INTRAVENOUS | Status: AC
  Filled 2019-06-23: qty 10

## 2019-06-23 MED ORDER — LIDOCAINE 2% (20 MG/ML) 5 ML SYRINGE
INTRAMUSCULAR | Status: AC
  Filled 2019-06-23: qty 5

## 2019-06-23 MED ORDER — HYDROCHLOROTHIAZIDE 12.5 MG PO CAPS
12.5000 mg | ORAL_CAPSULE | Freq: Every day | ORAL | Status: DC
  Administered 2019-06-24: 12.5 mg via ORAL
  Filled 2019-06-23: qty 1

## 2019-06-23 MED ORDER — KETOROLAC TROMETHAMINE 30 MG/ML IJ SOLN
INTRAMUSCULAR | Status: AC
  Filled 2019-06-23: qty 1

## 2019-06-23 MED ORDER — FENTANYL CITRATE (PF) 100 MCG/2ML IJ SOLN
INTRAMUSCULAR | Status: AC
  Filled 2019-06-23: qty 2

## 2019-06-23 MED ORDER — DIPHENHYDRAMINE HCL 12.5 MG/5ML PO ELIX: 12.5000 mg | ORAL_SOLUTION | Freq: Four times a day (QID) | ORAL | Status: DC | PRN

## 2019-06-23 MED ORDER — LIDOCAINE 2% (20 MG/ML) 5 ML SYRINGE
INTRAMUSCULAR | Status: DC | PRN
  Administered 2019-06-23: 100 mg via INTRAVENOUS

## 2019-06-23 MED ORDER — MIDAZOLAM HCL 5 MG/5ML IJ SOLN
INTRAMUSCULAR | Status: DC | PRN
  Administered 2019-06-23: 2 mg via INTRAVENOUS

## 2019-06-23 MED ORDER — ROCURONIUM BROMIDE 10 MG/ML (PF) SYRINGE
PREFILLED_SYRINGE | INTRAVENOUS | Status: DC | PRN
  Administered 2019-06-23: 70 mg via INTRAVENOUS
  Administered 2019-06-23: 30 mg via INTRAVENOUS
  Administered 2019-06-23: 20 mg via INTRAVENOUS
  Administered 2019-06-23: 10 mg via INTRAVENOUS

## 2019-06-23 MED ORDER — HYDROMORPHONE HCL 1 MG/ML IJ SOLN: 0.2500 mg | INTRAMUSCULAR | Status: DC | PRN

## 2019-06-23 MED ORDER — ACETAMINOPHEN 325 MG PO TABS
650.0000 mg | ORAL_TABLET | ORAL | Status: DC | PRN
  Administered 2019-06-24: 650 mg via ORAL
  Filled 2019-06-23: qty 2

## 2019-06-23 MED ORDER — SULFAMETHOXAZOLE-TRIMETHOPRIM 800-160 MG PO TABS: 1.0000 | ORAL_TABLET | Freq: Two times a day (BID) | ORAL | 0 refills | Status: DC

## 2019-06-23 MED ORDER — PROPOFOL 10 MG/ML IV BOLUS
INTRAVENOUS | Status: DC | PRN
  Administered 2019-06-23: 150 mg via INTRAVENOUS

## 2019-06-23 MED ORDER — HYDROMORPHONE HCL 1 MG/ML IJ SOLN
0.2500 mg | INTRAMUSCULAR | Status: DC | PRN
  Administered 2019-06-23 (×2): 0.5 mg via INTRAVENOUS
  Administered 2019-06-23 (×2): 0.25 mg via INTRAVENOUS
  Administered 2019-06-23: 0.5 mg via INTRAVENOUS

## 2019-06-23 MED ORDER — DEXAMETHASONE SODIUM PHOSPHATE 10 MG/ML IJ SOLN
INTRAMUSCULAR | Status: DC | PRN
  Administered 2019-06-23: 10 mg via INTRAVENOUS

## 2019-06-23 MED ORDER — LACTATED RINGERS IV SOLN: INTRAVENOUS | Status: DC

## 2019-06-23 MED ORDER — EPHEDRINE SULFATE-NACL 50-0.9 MG/10ML-% IV SOSY
PREFILLED_SYRINGE | INTRAVENOUS | Status: DC | PRN
  Administered 2019-06-23: 10 mg via INTRAVENOUS
  Administered 2019-06-23: 5 mg via INTRAVENOUS
  Administered 2019-06-23: 10 mg via INTRAVENOUS
  Administered 2019-06-23: 5 mg via INTRAVENOUS

## 2019-06-23 MED ORDER — DOCUSATE SODIUM 100 MG PO CAPS
100.0000 mg | ORAL_CAPSULE | Freq: Two times a day (BID) | ORAL | Status: DC
  Administered 2019-06-23 – 2019-06-24 (×2): 100 mg via ORAL
  Filled 2019-06-23 (×2): qty 1

## 2019-06-23 MED ORDER — SUGAMMADEX SODIUM 200 MG/2ML IV SOLN
INTRAVENOUS | Status: DC | PRN
  Administered 2019-06-23: 200 mg via INTRAVENOUS

## 2019-06-23 MED ORDER — BELLADONNA ALKALOIDS-OPIUM 16.2-60 MG RE SUPP
RECTAL | Status: AC
  Filled 2019-06-23: qty 1

## 2019-06-23 MED ORDER — KCL IN DEXTROSE-NACL 20-5-0.45 MEQ/L-%-% IV SOLN
INTRAVENOUS | Status: DC
  Filled 2019-06-23 (×3): qty 1000

## 2019-06-23 MED ORDER — BELLADONNA ALKALOIDS-OPIUM 16.2-60 MG RE SUPP
1.0000 | Freq: Once | RECTAL | Status: AC
  Administered 2019-06-23: 1 via RECTAL

## 2019-06-23 MED ORDER — MORPHINE SULFATE (PF) 2 MG/ML IV SOLN
2.0000 mg | INTRAVENOUS | Status: DC | PRN
  Administered 2019-06-23 (×2): 2 mg via INTRAVENOUS
  Filled 2019-06-23 (×2): qty 1

## 2019-06-23 MED ORDER — KETAMINE HCL 10 MG/ML IJ SOLN
INTRAMUSCULAR | Status: AC
  Filled 2019-06-23: qty 1

## 2019-06-23 MED ORDER — PROPOFOL 10 MG/ML IV BOLUS
INTRAVENOUS | Status: AC
  Filled 2019-06-23: qty 20

## 2019-06-23 MED ORDER — ONDANSETRON HCL 4 MG/2ML IJ SOLN
INTRAMUSCULAR | Status: AC
  Filled 2019-06-23: qty 2

## 2019-06-23 MED ORDER — HEPARIN SODIUM (PORCINE) 1000 UNIT/ML IJ SOLN
INTRAMUSCULAR | Status: AC
  Filled 2019-06-23: qty 1

## 2019-06-23 MED ORDER — LISINOPRIL 20 MG PO TABS
20.0000 mg | ORAL_TABLET | Freq: Every day | ORAL | Status: DC
  Administered 2019-06-24: 20 mg via ORAL
  Filled 2019-06-23: qty 1

## 2019-06-23 MED ORDER — KETOROLAC TROMETHAMINE 15 MG/ML IJ SOLN
15.0000 mg | Freq: Four times a day (QID) | INTRAMUSCULAR | Status: DC
  Administered 2019-06-23 – 2019-06-24 (×3): 15 mg via INTRAVENOUS
  Filled 2019-06-23 (×4): qty 1

## 2019-06-23 MED ORDER — PHENYLEPHRINE 40 MCG/ML (10ML) SYRINGE FOR IV PUSH (FOR BLOOD PRESSURE SUPPORT)
PREFILLED_SYRINGE | INTRAVENOUS | Status: DC | PRN
  Administered 2019-06-23: 80 ug via INTRAVENOUS
  Administered 2019-06-23: 120 ug via INTRAVENOUS
  Administered 2019-06-23 (×2): 80 ug via INTRAVENOUS
  Administered 2019-06-23: 40 ug via INTRAVENOUS

## 2019-06-23 MED ORDER — ONDANSETRON HCL 4 MG/2ML IJ SOLN
INTRAMUSCULAR | Status: DC | PRN
  Administered 2019-06-23: 4 mg via INTRAVENOUS

## 2019-06-23 MED ORDER — SODIUM CHLORIDE 0.9 % IR SOLN
Status: DC | PRN
  Administered 2019-06-23: 1000 mL via INTRAVESICAL

## 2019-06-23 MED ORDER — PROMETHAZINE HCL 25 MG/ML IJ SOLN: 6.2500 mg | INTRAMUSCULAR | Status: DC | PRN

## 2019-06-23 MED ORDER — LISINOPRIL-HYDROCHLOROTHIAZIDE 20-12.5 MG PO TABS: 1.0000 | ORAL_TABLET | Freq: Every day | ORAL | Status: DC

## 2019-06-23 MED ORDER — DIPHENHYDRAMINE HCL 50 MG/ML IJ SOLN: 12.5000 mg | Freq: Four times a day (QID) | INTRAMUSCULAR | Status: DC | PRN

## 2019-06-23 MED ORDER — ONDANSETRON HCL 4 MG/2ML IJ SOLN: 4.0000 mg | INTRAMUSCULAR | Status: DC | PRN

## 2019-06-23 MED ORDER — HYDROMORPHONE HCL 1 MG/ML IJ SOLN
0.2500 mg | INTRAMUSCULAR | Status: DC | PRN
  Administered 2019-06-23 (×2): 0.5 mg via INTRAVENOUS

## 2019-06-23 MED ORDER — CEFAZOLIN SODIUM-DEXTROSE 2-4 GM/100ML-% IV SOLN
2.0000 g | Freq: Once | INTRAVENOUS | Status: AC
  Administered 2019-06-23: 2 g via INTRAVENOUS
  Filled 2019-06-23: qty 100

## 2019-06-23 MED ORDER — MAGNESIUM CITRATE PO SOLN: 1.0000 | Freq: Once | ORAL | Status: DC

## 2019-06-23 MED ORDER — STERILE WATER FOR IRRIGATION IR SOLN
Status: DC | PRN
  Administered 2019-06-23: 1000 mL

## 2019-06-23 MED ORDER — ZOLPIDEM TARTRATE 5 MG PO TABS
5.0000 mg | ORAL_TABLET | Freq: Every evening | ORAL | Status: DC | PRN
  Administered 2019-06-23: 5 mg via ORAL
  Filled 2019-06-23: qty 1

## 2019-06-23 MED ORDER — DEXAMETHASONE SODIUM PHOSPHATE 10 MG/ML IJ SOLN
INTRAMUSCULAR | Status: AC
  Filled 2019-06-23: qty 1

## 2019-06-23 MED ORDER — FENTANYL CITRATE (PF) 100 MCG/2ML IJ SOLN
INTRAMUSCULAR | Status: DC | PRN
  Administered 2019-06-23 (×3): 50 ug via INTRAVENOUS

## 2019-06-23 SURGICAL SUPPLY — 59 items
APPLICATOR COTTON TIP 6 STRL (MISCELLANEOUS) ×2 IMPLANT
APPLICATOR COTTON TIP 6IN STRL (MISCELLANEOUS) ×3
CATH FOLEY 2WAY SLVR 18FR 30CC (CATHETERS) ×3 IMPLANT
CATH ROBINSON RED A/P 16FR (CATHETERS) ×3 IMPLANT
CATH ROBINSON RED A/P 8FR (CATHETERS) ×3 IMPLANT
CATH TIEMANN FOLEY 18FR 5CC (CATHETERS) ×3 IMPLANT
CHLORAPREP W/TINT 26 (MISCELLANEOUS) ×3 IMPLANT
CLIP VESOLOCK LG 6/CT PURPLE (CLIP) ×6 IMPLANT
COVER SURGICAL LIGHT HANDLE (MISCELLANEOUS) ×3 IMPLANT
COVER TIP SHEARS 8 DVNC (MISCELLANEOUS) ×2 IMPLANT
COVER TIP SHEARS 8MM DA VINCI (MISCELLANEOUS) ×1
COVER WAND RF STERILE (DRAPES) IMPLANT
CUTTER ECHEON FLEX ENDO 45 340 (ENDOMECHANICALS) ×3 IMPLANT
DECANTER SPIKE VIAL GLASS SM (MISCELLANEOUS) ×3 IMPLANT
DERMABOND ADVANCED (GAUZE/BANDAGES/DRESSINGS) ×1
DERMABOND ADVANCED .7 DNX12 (GAUZE/BANDAGES/DRESSINGS) ×2 IMPLANT
DRAIN CHANNEL RND F F (WOUND CARE) IMPLANT
DRAPE ARM DVNC X/XI (DISPOSABLE) ×8 IMPLANT
DRAPE COLUMN DVNC XI (DISPOSABLE) ×2 IMPLANT
DRAPE DA VINCI XI ARM (DISPOSABLE) ×4
DRAPE DA VINCI XI COLUMN (DISPOSABLE) ×1
DRAPE SURG IRRIG POUCH 19X23 (DRAPES) ×3 IMPLANT
DRSG TEGADERM 4X4.75 (GAUZE/BANDAGES/DRESSINGS) ×3 IMPLANT
ELECT REM PT RETURN 15FT ADLT (MISCELLANEOUS) ×3 IMPLANT
GAUZE SPONGE 2X2 8PLY STRL LF (GAUZE/BANDAGES/DRESSINGS) ×2 IMPLANT
GLOVE BIO SURGEON STRL SZ 6.5 (GLOVE) ×3 IMPLANT
GLOVE BIOGEL M STRL SZ7.5 (GLOVE) ×6 IMPLANT
GOWN STRL REUS W/TWL LRG LVL3 (GOWN DISPOSABLE) ×9 IMPLANT
HOLDER FOLEY CATH W/STRAP (MISCELLANEOUS) ×3 IMPLANT
IRRIG SUCT STRYKERFLOW 2 WTIP (MISCELLANEOUS) ×3
IRRIGATION SUCT STRKRFLW 2 WTP (MISCELLANEOUS) ×2 IMPLANT
IV LACTATED RINGERS 1000ML (IV SOLUTION) ×3 IMPLANT
KIT TURNOVER KIT A (KITS) IMPLANT
NDL SAFETY ECLIPSE 18X1.5 (NEEDLE) ×2 IMPLANT
NEEDLE HYPO 18GX1.5 SHARP (NEEDLE) ×1
PACK ROBOT UROLOGY CUSTOM (CUSTOM PROCEDURE TRAY) ×3 IMPLANT
PENCIL SMOKE EVACUATOR (MISCELLANEOUS) IMPLANT
SEAL CANN UNIV 5-8 DVNC XI (MISCELLANEOUS) ×8 IMPLANT
SEAL XI 5MM-8MM UNIVERSAL (MISCELLANEOUS) ×4
SET TUBE SMOKE EVAC HIGH FLOW (TUBING) ×3 IMPLANT
SOLUTION ELECTROLUBE (MISCELLANEOUS) ×3 IMPLANT
SPONGE GAUZE 2X2 STER 10/PKG (GAUZE/BANDAGES/DRESSINGS) ×1
STAPLE RELOAD 45 GRN (STAPLE) ×2 IMPLANT
STAPLE RELOAD 45MM GREEN (STAPLE) ×1
SUT ETHILON 3 0 PS 1 (SUTURE) ×3 IMPLANT
SUT MNCRL 3 0 RB1 (SUTURE) ×2 IMPLANT
SUT MNCRL 3 0 VIOLET RB1 (SUTURE) ×2 IMPLANT
SUT MNCRL AB 4-0 PS2 18 (SUTURE) ×6 IMPLANT
SUT MONOCRYL 3 0 RB1 (SUTURE) ×2
SUT VIC AB 0 CT1 27 (SUTURE) ×1
SUT VIC AB 0 CT1 27XBRD ANTBC (SUTURE) ×2 IMPLANT
SUT VIC AB 0 UR5 27 (SUTURE) ×3 IMPLANT
SUT VIC AB 2-0 SH 27 (SUTURE) ×1
SUT VIC AB 2-0 SH 27X BRD (SUTURE) ×2 IMPLANT
SUT VICRYL 0 UR6 27IN ABS (SUTURE) ×6 IMPLANT
SYR 27GX1/2 1ML LL SAFETY (SYRINGE) ×3 IMPLANT
TOWEL OR NON WOVEN STRL DISP B (DISPOSABLE) ×3 IMPLANT
TROCAR XCEL NON-BLD 5MMX100MML (ENDOMECHANICALS) IMPLANT
WATER STERILE IRR 1000ML POUR (IV SOLUTION) ×3 IMPLANT

## 2019-06-23 NOTE — Discharge Instructions (Signed)

## 2019-06-23 NOTE — Op Note (Signed)
Preoperative diagnosis: Clinically localized adenocarcinoma of the prostate (clinical stage T1c Nx Mx)  Postoperative diagnosis: Clinically localized adenocarcinoma of the prostate (clinical stage T1c Nx Mx)  Procedure:  1. Robotic assisted laparoscopic radical prostatectomy (right nerve sparing) 2. Bilateral robotic assisted laparoscopic pelvic lymphadenectomy  Surgeon: Pryor Curia. M.D.  Assistant(s): Debbrah Alar, PA-C  An assistant was required for this surgical procedure.  The duties of the assistant included but were not limited to suctioning, passing suture, camera manipulation, retraction. This procedure would not be able to be performed without an Environmental consultant.   Resident: Dr. Kerrie Pleasure  Anesthesia: General  Complications: None  EBL: 75 mL  IVF:  1500 mL crystalloid  Specimens: 1. Prostate and seminal vesicles 2. Right pelvic lymph nodes 3. Left pelvic lymph nodes  Disposition of specimens: Pathology  Drains: 1. 20 Fr coude catheter 2. # 19 Blake pelvic drain  Indication: Eugene Thompson is a 69 y.o. patient with clinically localized prostate cancer.  After a thorough review of the management options for treatment of prostate cancer, he elected to proceed with surgical therapy and the above procedure(s).  We have discussed the potential benefits and risks of the procedure, side effects of the proposed treatment, the likelihood of the patient achieving the goals of the procedure, and any potential problems that might occur during the procedure or recuperation. Informed consent has been obtained.  Description of procedure:  The patient was taken to the operating room and a general anesthetic was administered. He was given preoperative antibiotics, placed in the dorsal lithotomy position, and prepped and draped in the usual sterile fashion. Next a preoperative timeout was performed. A urethral catheter was placed into the bladder and a site was selected  near the umbilicus for placement of the camera port. This was placed using a standard open Hassan technique which allowed entry into the peritoneal cavity under direct vision and without difficulty. An 8 mm port was placed and a pneumoperitoneum established. The camera was then used to inspect the abdomen and there was no evidence of any intra-abdominal injuries or other abnormalities. The remaining abdominal ports were then placed. 8 mm robotic ports were placed in the right lower quadrant, left lower quadrant, and far left lateral abdominal wall. A 5 mm port was placed in the right upper quadrant and a 12 mm port was placed in the right lateral abdominal wall for laparoscopic assistance. All ports were placed under direct vision without difficulty. The surgical cart was then docked.   Utilizing the cautery scissors, the bladder was reflected posteriorly allowing entry into the space of Retzius and identification of the endopelvic fascia and prostate. The periprostatic fat was then removed from the prostate allowing full exposure of the endopelvic fascia. The endopelvic fascia was then incised from the apex back to the base of the prostate bilaterally and the underlying levator muscle fibers were swept laterally off the prostate thereby isolating the dorsal venous complex. The dorsal vein was then stapled and divided with a 45 mm Flex Echelon stapler. Attention then turned to the bladder neck which was divided anteriorly thereby allowing entry into the bladder and exposure of the urethral catheter. The catheter balloon was deflated and the catheter was brought into the operative field and used to retract the prostate anteriorly. The posterior bladder neck was then examined and was divided allowing further dissection between the bladder and prostate posteriorly until the vasa deferentia and seminal vessels were identified. The vasa deferentia were isolated, divided,  and lifted anteriorly. The seminal vesicles were  dissected down to their tips with care to control the seminal vascular arterial blood supply. These structures were then lifted anteriorly and the space between Denonvillier's fascia and the anterior rectum was developed with a combination of sharp and blunt dissection. This isolated the vascular pedicles of the prostate.  The lateral prostatic fascia on the right side of the prostate was then sharply incised allowing release of the neurovascular bundle. The vascular pedicle of the prostate on the right side was then ligated with Weck clips between the prostate and neurovascular bundle and divided with sharp cold scissor dissection resulting in neurovascular bundle preservation. On the left side, a wide non nerve sparing dissection was performed with Weck clips used to ligate the vascular pedicle of the prostate. The neurovascular bundle on the right side was then separated off the apex of the prostate and urethra.  The urethra was then sharply transected allowing the prostate specimen to be disarticulated. The pelvis was copiously irrigated and hemostasis was ensured. There was no evidence for rectal injury.  Attention then turned to the right pelvic sidewall. The fibrofatty tissue between the external iliac vein, confluence of the iliac vessels, hypogastric artery, and Cooper's ligament was dissected free from the pelvic sidewall with care to preserve the obturator nerve. Weck clips were used for lymphostasis and hemostasis. An identical procedure was performed on the contralateral side and the lymphatic packets were removed for permanent pathologic analysis.  Attention then turned to the urethral anastomosis. A 2-0 Vicryl slip knot was placed between Denonvillier's fascia, the posterior bladder neck, and the posterior urethra to reapproximate these structures. A double-armed 3-0 Monocryl suture was then used to perform a 360 running tension-free anastomosis between the bladder neck and urethra. A new  urethral catheter was then placed into the bladder and irrigated. There were no blood clots within the bladder and the anastomosis appeared to be watertight. A #19 Blake drain was then brought through the left lateral 8 mm port site and positioned appropriately within the pelvis. It was secured to the skin with a nylon suture. The surgical cart was then undocked. The right lateral 12 mm port site was closed at the fascial level with a 0 Vicryl suture placed laparoscopically. All remaining ports were then removed under direct vision. The prostate specimen was removed intact within the Endopouch retrieval bag via the periumbilical camera port site. This fascial opening was closed with two running 0 Vicryl sutures. 0.25% Marcaine was then injected into all port sites and all incisions were reapproximated at the skin level with 4-0 Monocryl subcuticular sutures. Dermabond was applied. The patient appeared to tolerate the procedure well and without complications. The patient was able to be extubated and transferred to the recovery unit in satisfactory condition.   Pryor Curia MD

## 2019-06-23 NOTE — Anesthesia Preprocedure Evaluation (Signed)
Anesthesia Evaluation  Patient identified by MRN, date of birth, ID band Patient awake    Reviewed: Allergy & Precautions, NPO status , Patient's Chart, lab work & pertinent test results  Airway Mallampati: II  TM Distance: >3 FB Neck ROM: Full    Dental no notable dental hx.    Pulmonary neg pulmonary ROS,    Pulmonary exam normal breath sounds clear to auscultation       Cardiovascular hypertension, Pt. on medications Normal cardiovascular exam Rhythm:Regular Rate:Normal     Neuro/Psych negative neurological ROS  negative psych ROS   GI/Hepatic negative GI ROS, Neg liver ROS,   Endo/Other  negative endocrine ROS  Renal/GU negative Renal ROS  negative genitourinary   Musculoskeletal negative musculoskeletal ROS (+)   Abdominal   Peds negative pediatric ROS (+)  Hematology negative hematology ROS (+)   Anesthesia Other Findings   Reproductive/Obstetrics negative OB ROS                             Anesthesia Physical Anesthesia Plan  ASA: II  Anesthesia Plan: General   Post-op Pain Management:    Induction: Intravenous  PONV Risk Score and Plan: 2 and Ondansetron, Dexamethasone and Treatment may vary due to age or medical condition  Airway Management Planned: Oral ETT  Additional Equipment:   Intra-op Plan:   Post-operative Plan: Extubation in OR  Informed Consent: I have reviewed the patients History and Physical, chart, labs and discussed the procedure including the risks, benefits and alternatives for the proposed anesthesia with the patient or authorized representative who has indicated his/her understanding and acceptance.     Dental advisory given  Plan Discussed with: CRNA and Surgeon  Anesthesia Plan Comments:         Anesthesia Quick Evaluation  

## 2019-06-23 NOTE — Transfer of Care (Signed)
Immediate Anesthesia Transfer of Care Note  Patient: Eugene Thompson  Procedure(s) Performed: Procedure(s): XI ROBOTIC ASSISTED LAPAROSCOPIC RADICAL PROSTATECTOMY LEVEL 2 (N/A) LYMPHADENECTOMY, PELVIC (Bilateral)  Patient Location: PACU  Anesthesia Type:General  Level of Consciousness: Alert, Awake, Oriented  Airway & Oxygen Therapy: Patient Spontanous Breathing  Post-op Assessment: Report given to RN  Post vital signs: Reviewed and stable  Last Vitals:  Vitals:   06/23/19 0944  BP: (!) 152/93  Pulse: 68  Resp: 17  Temp: 36.7 C  SpO2: 123XX123    Complications: No apparent anesthesia complications

## 2019-06-23 NOTE — Anesthesia Procedure Notes (Signed)
Procedure Name: Intubation Date/Time: 06/23/2019 11:00 AM Performed by: Gerald Leitz, CRNA Pre-anesthesia Checklist: Patient identified, Patient being monitored, Timeout performed, Emergency Drugs available and Suction available Patient Re-evaluated:Patient Re-evaluated prior to induction Oxygen Delivery Method: Circle system utilized Preoxygenation: Pre-oxygenation with 100% oxygen Induction Type: IV induction Ventilation: Mask ventilation without difficulty Laryngoscope Size: Mac and 3 Grade View: Grade I Tube type: Oral Tube size: 8.0 mm Number of attempts: 1 Placement Confirmation: ETT inserted through vocal cords under direct vision,  positive ETCO2 and breath sounds checked- equal and bilateral Secured at: 23 cm Tube secured with: Tape Dental Injury: Teeth and Oropharynx as per pre-operative assessment

## 2019-06-23 NOTE — Anesthesia Postprocedure Evaluation (Signed)
Anesthesia Post Note  Patient: Eugene Thompson  Procedure(s) Performed: XI ROBOTIC ASSISTED LAPAROSCOPIC RADICAL PROSTATECTOMY LEVEL 2 (N/A ) LYMPHADENECTOMY, PELVIC (Bilateral )     Patient location during evaluation: PACU Anesthesia Type: General Level of consciousness: awake and alert Pain management: pain level controlled Vital Signs Assessment: post-procedure vital signs reviewed and stable Respiratory status: spontaneous breathing, nonlabored ventilation, respiratory function stable and patient connected to nasal cannula oxygen Cardiovascular status: blood pressure returned to baseline and stable Postop Assessment: no apparent nausea or vomiting Anesthetic complications: no    Last Vitals:  Vitals:   06/23/19 1500 06/23/19 1515  BP: 109/66 135/84  Pulse: 82 88  Resp: 13 (!) 25  Temp:    SpO2: 98% 98%    Last Pain:  Vitals:   06/23/19 1500  TempSrc:   PainSc: 8                  Ashley Montminy S

## 2019-06-23 NOTE — Progress Notes (Signed)
Patient ID: Eugene Thompson, male   DOB: 22-May-1950, 69 y.o.   MRN: [id number]  Post-op note  Subjective: The patient is doing well.  No complaints.  Objective: Vital signs in last 24 hours: Temp:  [97.4 F (36.3 C)-98 F (36.7 C)] 97.4 F (36.3 C) (03/01 1633) Pulse Rate:  [68-92] 85 (03/01 1633) Resp:  [12-31] 18 (03/01 1633) BP: (93-152)/(64-93) 131/84 (03/01 1633) SpO2:  [94 %-100 %] 94 % (03/01 1633) Weight:  [95.3 kg] 95.3 kg (03/01 0944)  Intake/Output from previous day: No intake/output data recorded. Intake/Output this shift: Total I/O In: 2500 [I.V.:1500; IV Piggyback:1000] Out: 75 [Blood:75]  Physical Exam:  General: Alert and oriented. Abdomen: Soft, Nondistended. Incisions: Clean and dry. GU: Urine pink.  Lab Results: Recent Labs    06/23/19 1455  HGB 13.7  HCT 42.2    Assessment/Plan: POD#0   1) Continue to monitor, ambulate, IS   Pryor Curia. MD   LOS: 0 days   Dutch Gray 06/23/2019, 5:22 PM

## 2019-06-24 DIAGNOSIS — C61 Malignant neoplasm of prostate: Secondary | ICD-10-CM | POA: Diagnosis not present

## 2019-06-24 LAB — HEMOGLOBIN AND HEMATOCRIT, BLOOD
HCT: 40.1 % (ref 39.0–52.0)
Hemoglobin: 13.2 g/dL (ref 13.0–17.0)

## 2019-06-24 MED ORDER — TRAMADOL HCL 50 MG PO TABS
50.0000 mg | ORAL_TABLET | Freq: Four times a day (QID) | ORAL | Status: DC | PRN
  Administered 2019-06-24: 50 mg via ORAL
  Filled 2019-06-24: qty 1

## 2019-06-24 MED ORDER — CHLORHEXIDINE GLUCONATE CLOTH 2 % EX PADS
6.0000 | MEDICATED_PAD | Freq: Every day | CUTANEOUS | Status: DC
  Administered 2019-06-24: 6 via TOPICAL

## 2019-06-24 MED ORDER — BISACODYL 10 MG RE SUPP
10.0000 mg | Freq: Once | RECTAL | Status: AC
  Administered 2019-06-24: 10 mg via RECTAL
  Filled 2019-06-24: qty 1

## 2019-06-24 NOTE — Progress Notes (Signed)
1 Day Post-Op Subjective: The patient is doing well.  No nausea or vomiting. Pain is adequately controlled.  Objective: Vital signs in last 24 hours: Temp:  [97.4 F (36.3 C)-99.1 F (37.3 C)] 98.2 F (36.8 C) (03/02 0428) Pulse Rate:  [61-92] 72 (03/02 0428) Resp:  [12-31] 14 (03/02 0428) BP: (93-152)/(64-93) 144/92 (03/02 0428) SpO2:  [94 %-100 %] 100 % (03/02 0428) Weight:  [95.3 kg] 95.3 kg (03/01 0944)  Intake/Output from previous day: 03/01 0701 - 03/02 0700 In: 6390 [P.O.:600; I.V.:3158.2; IV Piggyback:2631.8] Out: 1345 [Urine:1200; Drains:70; Blood:75] Intake/Output this shift: No intake/output data recorded.  Physical Exam:  General: Alert and oriented. CV: RRR Lungs: Clear bilaterally. GI: Soft, Nondistended. Incisions: Clean, dry, and intact Urine: Clear Extremities: Nontender, no erythema, no edema.  Lab Results: Recent Labs    06/23/19 1455 06/24/19 0423  HGB 13.7 13.2  HCT 42.2 40.1      Assessment/Plan: POD# 1 s/p robotic prostatectomy.  1) SL IVF 2) Ambulate, Incentive spirometry 3) Transition to oral pain medication 4) Dulcolax suppository 5) D/C pelvic drain 6) Plan for likely discharge later today    LOS: 0 days   Eugene Thompson 06/24/2019, 7:19 AM

## 2019-06-24 NOTE — Progress Notes (Signed)
Went over discharge papers with patient and wife.  All questions answered.  Foley teaching completed with teach back.  Patient tolerating ambulation in the hall and in the room.  VSS.  AVS given to patient.

## 2019-06-24 NOTE — Discharge Summary (Signed)
  Date of admission: 06/23/2019  Date of discharge: 06/24/2019  Admission diagnosis: Prostate Cancer  Discharge diagnosis: Prostate Cancer  History and Physical: For full details, please see admission history and physical. Briefly, Eugene Thompson is a 69 y.o. gentleman with localized prostate cancer.  After discussing management/treatment options, he elected to proceed with surgical treatment.  Hospital Course: QUINTAN SALDIVAR was taken to the operating room on 06/23/2019 and underwent a robotic assisted laparoscopic radical prostatectomy. He tolerated this procedure well and without complications. Postoperatively, he was able to be transferred to a regular hospital room following recovery from anesthesia.  He was able to begin ambulating the night of surgery. He remained hemodynamically stable overnight.  He had excellent urine output with appropriately minimal output from his pelvic drain and his pelvic drain was removed on POD #1.  He was transitioned to oral pain medication, tolerated a clear liquid diet, and had met all discharge criteria and was able to be discharged home later on POD#1.  Laboratory values:  Recent Labs    06/23/19 1455 06/24/19 0423  HGB 13.7 13.2  HCT 42.2 40.1    Disposition: Home  Discharge instruction: He was instructed to be ambulatory but to refrain from heavy lifting, strenuous activity, or driving. He was instructed on urethral catheter care.  Discharge medications:   Allergies as of 06/24/2019   No Known Allergies     Medication List    STOP taking these medications   ascorbic acid 500 MG tablet Commonly known as: VITAMIN C   b complex vitamins tablet   finasteride 5 MG tablet Commonly known as: PROSCAR   ibuprofen 200 MG tablet Commonly known as: ADVIL   meloxicam 7.5 MG tablet Commonly known as: MOBIC   multivitamin tablet   tamsulosin 0.4 MG Caps capsule Commonly known as: FLOMAX   Vitamin D3 25 MCG tablet Commonly known as:  Vitamin D     TAKE these medications   lisinopril-hydrochlorothiazide 20-12.5 MG tablet Commonly known as: ZESTORETIC Take 1 tablet by mouth daily.   sulfamethoxazole-trimethoprim 800-160 MG tablet Commonly known as: BACTRIM DS Take 1 tablet by mouth 2 (two) times daily. Start the day prior to foley removal appointment   traMADol 50 MG tablet Commonly known as: Ultram Take 1-2 tablets (50-100 mg total) by mouth every 6 (six) hours as needed for moderate pain or severe pain.       Followup: He will followup in 1 week for catheter removal and to discuss his surgical pathology results.

## 2019-06-26 LAB — SURGICAL PATHOLOGY

## 2019-07-16 DIAGNOSIS — M62838 Other muscle spasm: Secondary | ICD-10-CM | POA: Diagnosis not present

## 2019-07-16 DIAGNOSIS — N393 Stress incontinence (female) (male): Secondary | ICD-10-CM | POA: Diagnosis not present

## 2019-07-16 DIAGNOSIS — M6281 Muscle weakness (generalized): Secondary | ICD-10-CM | POA: Diagnosis not present

## 2019-10-08 DIAGNOSIS — C61 Malignant neoplasm of prostate: Secondary | ICD-10-CM | POA: Diagnosis not present

## 2019-10-15 DIAGNOSIS — C61 Malignant neoplasm of prostate: Secondary | ICD-10-CM | POA: Diagnosis not present

## 2019-10-15 DIAGNOSIS — N5201 Erectile dysfunction due to arterial insufficiency: Secondary | ICD-10-CM | POA: Diagnosis not present

## 2019-10-15 DIAGNOSIS — N393 Stress incontinence (female) (male): Secondary | ICD-10-CM | POA: Diagnosis not present

## 2019-10-23 DIAGNOSIS — Z1339 Encounter for screening examination for other mental health and behavioral disorders: Secondary | ICD-10-CM | POA: Diagnosis not present

## 2019-10-23 DIAGNOSIS — Z0000 Encounter for general adult medical examination without abnormal findings: Secondary | ICD-10-CM | POA: Diagnosis not present

## 2019-10-23 DIAGNOSIS — E663 Overweight: Secondary | ICD-10-CM | POA: Diagnosis not present

## 2019-10-23 DIAGNOSIS — Z1331 Encounter for screening for depression: Secondary | ICD-10-CM | POA: Diagnosis not present

## 2019-10-23 DIAGNOSIS — Z79899 Other long term (current) drug therapy: Secondary | ICD-10-CM | POA: Diagnosis not present

## 2019-10-23 DIAGNOSIS — Z6829 Body mass index (BMI) 29.0-29.9, adult: Secondary | ICD-10-CM | POA: Diagnosis not present

## 2019-10-23 DIAGNOSIS — I10 Essential (primary) hypertension: Secondary | ICD-10-CM | POA: Diagnosis not present

## 2019-10-23 DIAGNOSIS — E782 Mixed hyperlipidemia: Secondary | ICD-10-CM | POA: Diagnosis not present

## 2019-10-23 DIAGNOSIS — R739 Hyperglycemia, unspecified: Secondary | ICD-10-CM | POA: Diagnosis not present

## 2019-10-23 DIAGNOSIS — M159 Polyosteoarthritis, unspecified: Secondary | ICD-10-CM | POA: Diagnosis not present

## 2019-10-23 DIAGNOSIS — C61 Malignant neoplasm of prostate: Secondary | ICD-10-CM | POA: Diagnosis not present

## 2020-05-18 DIAGNOSIS — M791 Myalgia, unspecified site: Secondary | ICD-10-CM | POA: Diagnosis not present

## 2020-05-18 DIAGNOSIS — R051 Acute cough: Secondary | ICD-10-CM | POA: Diagnosis not present

## 2020-05-18 DIAGNOSIS — R519 Headache, unspecified: Secondary | ICD-10-CM | POA: Diagnosis not present

## 2020-05-18 DIAGNOSIS — R0981 Nasal congestion: Secondary | ICD-10-CM | POA: Diagnosis not present

## 2020-05-18 DIAGNOSIS — Z20828 Contact with and (suspected) exposure to other viral communicable diseases: Secondary | ICD-10-CM | POA: Diagnosis not present

## 2020-05-18 DIAGNOSIS — R5383 Other fatigue: Secondary | ICD-10-CM | POA: Diagnosis not present

## 2020-05-18 DIAGNOSIS — J209 Acute bronchitis, unspecified: Secondary | ICD-10-CM | POA: Diagnosis not present

## 2023-12-04 ENCOUNTER — Other Ambulatory Visit (HOSPITAL_BASED_OUTPATIENT_CLINIC_OR_DEPARTMENT_OTHER): Admitting: Radiology

## 2023-12-04 ENCOUNTER — Encounter (HOSPITAL_BASED_OUTPATIENT_CLINIC_OR_DEPARTMENT_OTHER): Payer: Self-pay

## 2023-12-04 ENCOUNTER — Ambulatory Visit (INDEPENDENT_AMBULATORY_CARE_PROVIDER_SITE_OTHER): Admit: 2023-12-04 | Discharge: 2023-12-04 | Disposition: A | Discharge status: Home / Self Care | Admitting: Radiology

## 2023-12-04 ENCOUNTER — Other Ambulatory Visit (HOSPITAL_BASED_OUTPATIENT_CLINIC_OR_DEPARTMENT_OTHER): Payer: Self-pay

## 2023-12-04 ENCOUNTER — Ambulatory Visit (HOSPITAL_BASED_OUTPATIENT_CLINIC_OR_DEPARTMENT_OTHER)
Admission: RE | Admit: 2023-12-04 | Discharge: 2023-12-04 | Disposition: A | Discharge status: Home / Self Care | Source: Ambulatory Visit

## 2023-12-04 VITALS — BP 154/94 | HR 72 | Temp 98.2°F | Resp 20

## 2023-12-04 DIAGNOSIS — R051 Acute cough: Secondary | ICD-10-CM | POA: Diagnosis not present

## 2023-12-04 DIAGNOSIS — U071 COVID-19: Secondary | ICD-10-CM | POA: Diagnosis not present

## 2023-12-04 DIAGNOSIS — J208 Acute bronchitis due to other specified organisms: Secondary | ICD-10-CM

## 2023-12-04 HISTORY — DX: Malignant neoplasm of prostate: C61

## 2023-12-04 MED ORDER — IPRATROPIUM-ALBUTEROL 0.5-2.5 (3) MG/3ML IN SOLN
3.0000 mL | Freq: Once | RESPIRATORY_TRACT | Status: AC
  Administered 2023-12-04 (×2): 3 mL via RESPIRATORY_TRACT

## 2023-12-04 MED ORDER — ALBUTEROL SULFATE HFA 108 (90 BASE) MCG/ACT IN AERS
2.0000 | INHALATION_SPRAY | RESPIRATORY_TRACT | 0 refills | Status: AC | PRN
  Filled 2023-12-04: qty 6.7, 17d supply, fill #0

## 2023-12-04 MED ORDER — PROMETHAZINE-DM 6.25-15 MG/5ML PO SYRP
5.0000 mL | ORAL_SOLUTION | Freq: Four times a day (QID) | ORAL | 0 refills | Status: AC | PRN
  Filled 2023-12-04: qty 118, 6d supply, fill #0

## 2023-12-04 MED ORDER — COMPACT SPACE CHAMBER DEVI
0 refills | Status: AC
  Filled 2023-12-04: qty 1, 14d supply, fill #0

## 2023-12-04 MED ORDER — DOXYCYCLINE HYCLATE 100 MG PO CAPS
100.0000 mg | ORAL_CAPSULE | Freq: Two times a day (BID) | ORAL | 0 refills | Status: AC
  Filled 2023-12-04: qty 14, 7d supply, fill #0

## 2023-12-04 NOTE — ED Provider Notes (Signed)
 [facility]    CSN: [id number] Arrival date & time: 12/04/23  1257      History   Chief Complaint Chief Complaint  Patient presents with   Cough    HPI Eugene Thompson is a 73 y.o. male.   73 year old male who [facility].  On approximately 11/21/2023 or earlier, he tested positive for COVID with a home test.  On approximately 11/23/2023, he had a virtual visit with his [place] practitioner and he was provided Mucinex and prednisone 20 mg daily for 5 days.  Initially he felt better but by 11/27/2023, he was feeling sick again and was having deep cough and producing a lot of white and yellow sputum.  He is having a persistent cough with chest tightness and some wheezing.  He denies fever, chills, body aches, headache, nausea, vomiting, constipation, diarrhea.  He worries that the COVID is making him sick or something else is started.   Cough Associated symptoms: rhinorrhea, shortness of breath and wheezing   Associated symptoms: no chest pain, no chills, no ear pain, no fever, no rash and no sore throat     Past Medical History:  Diagnosis Date   Hypertension    Prostate cancer [facility])     Patient Active Problem List   Diagnosis Date Noted   Prostate cancer (HCC) 06/23/2019    Past Surgical History:  Procedure Laterality Date   LYMPHADENECTOMY Bilateral 06/23/2019   Procedure: LYMPHADENECTOMY, PELVIC;  Surgeon: Renda Glance, MD;  Location: [facility];  Service: Urology;  Laterality: Bilateral;   REPLACEMENT TOTAL KNEE Right    ROBOT ASSISTED LAPAROSCOPIC RADICAL PROSTATECTOMY N/A 06/23/2019   Procedure: XI ROBOTIC ASSISTED LAPAROSCOPIC RADICAL PROSTATECTOMY LEVEL 2;  Surgeon: Renda Glance, MD;  Location: [facility];  Service: Urology;  Laterality: N/A;   ROTATOR CUFF REPAIR Bilateral        Home Medications    Prior to Admission medications   Medication Sig Start Date End Date Taking? Authorizing Provider  albuterol  (VENTOLIN  HFA) 108 (90 Base) MCG/ACT  inhaler Inhale 2 puffs into the lungs every 4 (four) hours as needed for wheezing or shortness of breath. 12/04/23  Yes Ival Domino, FNP  doxycycline  (VIBRAMYCIN ) 100 MG capsule Take 1 capsule (100 mg total) by mouth 2 (two) times daily for 7 days. 12/04/23 12/11/23 Yes Ival Domino, FNP  Multiple Vitamin (MULTIVITAMIN) tablet Take 1 tablet by mouth daily.   Yes [provider]  promethazine -dextromethorphan (PROMETHAZINE -DM) 6.25-15 MG/5ML syrup Take 5 mLs by mouth 4 (four) times daily as needed for cough. Do not use and drive - May make drowsy. 12/04/23  Yes Ival Domino, FNP  Spacer/Aero-Holding Chambers (COMPACT SPACE CHAMBER) DEVI Use with the albuterol  inhaler 12/04/23  Yes Ival Domino, FNP  lisinopril -hydrochlorothiazide  (ZESTORETIC ) 20-12.5 MG tablet Take 1 tablet by mouth daily. 05/20/19   [provider]    Family History History reviewed. No pertinent family history.  Social History Social History   Tobacco Use   Smoking status: Never   Smokeless tobacco: Never  Vaping Use   Vaping status: Never Used  Substance Use Topics   Alcohol use: Yes    Comment: occasional   Drug use: Never     Allergies   Patient has no known allergies.   Review of Systems Review of Systems  Constitutional:  Negative for chills and fever.  HENT:  Positive for congestion, postnasal drip and rhinorrhea. Negative for ear pain and sore throat.   Eyes:  Negative for  pain and visual disturbance.  Respiratory:  Positive for cough, chest tightness, shortness of breath and wheezing.   Cardiovascular:  Negative for chest pain and palpitations.  Gastrointestinal:  Negative for abdominal pain, constipation, diarrhea, nausea and vomiting.  Genitourinary:  Negative for dysuria and hematuria.  Musculoskeletal:  Negative for arthralgias and back pain.  Skin:  Negative for color change and rash.  Neurological:  Negative for seizures and syncope.  All other systems reviewed and are  negative.    Physical Exam Triage Vital Signs ED Triage Vitals  Encounter Vitals Group     BP 12/04/23 1313 (!) 154/94     Girls Systolic BP Percentile --      Girls Diastolic BP Percentile --      Boys Systolic BP Percentile --      Boys Diastolic BP Percentile --      Pulse Rate 12/04/23 1313 72     Resp 12/04/23 1313 20     Temp 12/04/23 1313 98.2 F (36.8 C)     Temp Source 12/04/23 1313 Oral     SpO2 12/04/23 1313 95 %     Weight --      Height --      Head Circumference --      Peak Flow --      Pain Score 12/04/23 1310 5     Pain Loc --      Pain Education --      Exclude from Growth Chart --    No data found.  Updated Vital Signs BP (!) 154/94 (BP Location: Right Arm)   Pulse 72   Temp 98.2 F (36.8 C) (Oral)   Resp 20   SpO2 95%   Visual Acuity Right Eye Distance:   Left Eye Distance:   Bilateral Distance:    Right Eye Near:   Left Eye Near:    Bilateral Near:     Physical Exam Vitals and nursing note reviewed.  Constitutional:      General: He is not in acute distress.    Appearance: He is well-developed. He is not ill-appearing, toxic-appearing or diaphoretic.  HENT:     Head: Normocephalic and atraumatic.     Right Ear: Hearing, tympanic membrane, ear canal and external ear normal.     Left Ear: Hearing, tympanic membrane, ear canal and external ear normal.     Nose: Congestion and rhinorrhea present. Rhinorrhea is clear.     Right Sinus: Maxillary sinus tenderness present. No frontal sinus tenderness.     Left Sinus: Maxillary sinus tenderness present. No frontal sinus tenderness.     Comments: Mild maxillary sinus pressure or pain.    Mouth/Throat:     Lips: Pink.     Mouth: Mucous membranes are moist.     Pharynx: Uvula midline. No oropharyngeal exudate or posterior oropharyngeal erythema.     Tonsils: No tonsillar exudate.  Eyes:     Conjunctiva/sclera: Conjunctivae normal.     Pupils: Pupils are equal, round, and reactive to light.   Cardiovascular:     Rate and Rhythm: Normal rate and regular rhythm.     Heart sounds: S1 normal and S2 normal. No murmur heard. Pulmonary:     Effort: Pulmonary effort is normal. No respiratory distress.     Breath sounds: Examination of the right-upper field reveals wheezing. Examination of the left-upper field reveals wheezing. Examination of the right-middle field reveals wheezing. Examination of the left-middle field reveals wheezing. Examination of the right-lower field reveals  wheezing. Examination of the left-lower field reveals wheezing. Wheezing (Mild inspiratory wheezes that are intermittent.) present. No decreased breath sounds, rhonchi or rales.     Comments: Reassessment after DuoNeb treatment: Oxygen saturation is up to 98% on room air.  Breath sounds are clear.  Patient does not feel any improvement in his breathing at all. Abdominal:     General: Bowel sounds are normal.     Palpations: Abdomen is soft.     Tenderness: There is no abdominal tenderness.  Musculoskeletal:        General: No swelling.     Cervical back: Neck supple.  Lymphadenopathy:     Head:     Right side of head: No submental, submandibular, tonsillar, preauricular or posterior auricular adenopathy.     Left side of head: No submental, submandibular, tonsillar, preauricular or posterior auricular adenopathy.     Cervical: Cervical adenopathy present.     Right cervical: Superficial cervical adenopathy present.     Left cervical: Superficial cervical adenopathy present.  Skin:    General: Skin is warm and dry.     Capillary Refill: Capillary refill takes less than 2 seconds.     Findings: No rash.  Neurological:     Mental Status: He is alert and oriented to person, place, and time.  Psychiatric:        Mood and Affect: Mood normal.      UC Treatments / Results  Labs (all labs ordered are listed, but only abnormal results are displayed) Labs Reviewed - No data to  display  EKG   Radiology DG Chest 2 View Result Date: 12/04/2023 CLINICAL DATA:  Cough.  Recent COVID. EXAM: CHEST - 2 VIEW COMPARISON:  Remote radiograph 10/09/2005 FINDINGS: The cardiomediastinal contours are normal. Chronic mild eventration of right hemidiaphragm. Pulmonary vasculature is normal. No consolidation, pleural effusion, or pneumothorax. No acute osseous abnormalities are seen. IMPRESSION: No active cardiopulmonary disease. Electronically Signed   By: Andrea Gasman M.D.   On: 12/04/2023 14:53    Procedures Procedures (including critical care time)  Medications Ordered in UC Medications  ipratropium-albuterol  (DUONEB) 0.5-2.5 (3) MG/3ML nebulizer solution 3 mL (3 mLs Nebulization Given 12/04/23 1353)    Initial Impression / Assessment and Plan / UC Course  I have reviewed the triage vital signs and the nursing notes.  Pertinent labs & imaging results that were available during my care of the patient were reviewed by me and considered in my medical decision making (see chart for details).  Plan of Care: Acute viral bronchitis versus early pneumonia secondary to recent COVID infection: Chest x-ray does not show consolidation but it is hazy.  Will treat for possible early pneumonia versus significant bronchitis.  He had a huge improvement with the DuoNeb treatment but then he felt jittery.  Use albuterol  inhaler with spacer, 2 puffs, every 4 hours if needed for wheezing.  Doxycycline  100 mg twice daily for 7 days.  Get plenty of fluids and rest.  Promethazine  DM, 5 mL, every 6 hours as needed for cough.  Follow-up if symptoms do not improve, worsen or new symptoms occur.  I reviewed the plan of care with the patient and/or the patient's guardian.  The patient and/or guardian had time to ask questions and acknowledged that the questions were answered.  I provided instruction on symptoms or reasons to return here or to go to an ER, if symptoms/condition did not improve, worsened  or if new symptoms occurred.  Final Clinical Impressions(s) / UC  Diagnoses   Final diagnoses:  Acute cough  COVID-19 virus infection  Acute viral bronchitis     Discharge Instructions      Acute viral bronchitis versus early pneumonia secondary to recent COVID infection with cough: Chest x-ray is hazy but does not show consolidation.  Based on his symptoms, will treat for early pneumonia.  Doxycycline  100 mg twice daily for 7 days.  Promethazine  DM, 5 mL, every 6 hours if needed for cough.  Albuterol  inhaler with spacer, 2 puffs every 4 hours if needed for wheezing.  Get plenty of fluids and rest.  Encouraged use of a probiotic daily to prevent antibiotic related diarrhea.  Follow-up here or with primary care in 2 weeks for recheck or sooner as needed.     ED Prescriptions     Medication Sig Dispense Auth. Provider   albuterol  (VENTOLIN  HFA) 108 (90 Base) MCG/ACT inhaler Inhale 2 puffs into the lungs every 4 (four) hours as needed for wheezing or shortness of breath. 6.7 g Ival Domino, FNP   Spacer/Aero-Holding Chambers (COMPACT SPACE CHAMBER) DEVI Use with the albuterol  inhaler 1 each Ival Domino, FNP   promethazine -dextromethorphan (PROMETHAZINE -DM) 6.25-15 MG/5ML syrup Take 5 mLs by mouth 4 (four) times daily as needed for cough. Do not use and drive - May make drowsy. 118 mL Ival Domino, FNP   doxycycline  (VIBRAMYCIN ) 100 MG capsule Take 1 capsule (100 mg total) by mouth 2 (two) times daily for 7 days. 14 capsule Ival Domino, FNP      PDMP not reviewed this encounter.   Ival Domino, FNP 12/04/23 1455

## 2023-12-04 NOTE — Discharge Instructions (Addendum)
 Acute viral bronchitis versus early pneumonia secondary to recent COVID infection with cough: Chest x-ray is hazy but does not show consolidation.  Based on his symptoms, will treat for early pneumonia.  Doxycycline  100 mg twice daily for 7 days.  Promethazine  DM, 5 mL, every 6 hours if needed for cough.  Albuterol  inhaler with spacer, 2 puffs every 4 hours if needed for wheezing.  Get plenty of fluids and rest.  Encouraged use of a probiotic daily to prevent antibiotic related diarrhea.  Follow-up here or with primary care in 2 weeks for recheck or sooner as needed.

## 2023-12-04 NOTE — ED Triage Notes (Signed)
 Pt states around 2 weeks he test positive for COVID on a home test. A few days later he had a virtual visit with the [place] and was prescribed prednisone and musinex. He started to feel better then about 2 days ago he started to have increased cough-productive and chest tightness. Pt denies fever, chills, body aches, facial pain, and HA.

## 2023-12-28 ENCOUNTER — Telehealth (HOSPITAL_BASED_OUTPATIENT_CLINIC_OR_DEPARTMENT_OTHER): Payer: Self-pay

## 2023-12-28 NOTE — Telephone Encounter (Signed)
 Received call from pt stating that he has finished his antibiotics and still is experiencing symptoms. he wants to know if he can receive another prescription or needs to come for another visit. After discussing with Lauraine Settler, FNP, he would need to be reevaluated for his symptoms since it has seen so long since his apt. He was seen on 12/04/23 for possible early pneumonia after a Covid-19 infection. He was given Doxycycline  100mg  twice daily x10 days. He is still having nasal congestion and fatigue. He states that the [facility] told him they didn't want to pay for his visit here so he is reluctant to come back here without knowing if it was going to be covered or not. He has a civilian PCP listed but he has not seen him in 6 years. I suggested he contact the [place] and he states he probably can not be seen by them either. He states he is going to continue taking musinex and if it gets worst he will call the [place]. I advised he call to go ahead and call the [place] since he is concerned. He understood and had no further questions.
